# Patient Record
Sex: Male | Born: 1970 | Race: White | Hispanic: No | Marital: Married | Smoking: Current every day smoker
Health system: Southern US, Community
[De-identification: ages and names within clinical notes are randomized; demographics above are authoritative.]

## PROBLEM LIST (undated history)

## (undated) DIAGNOSIS — Z720 Tobacco use: Secondary | ICD-10-CM

## (undated) HISTORY — PX: TYMPANOSTOMY TUBE PLACEMENT: SHX32

## (undated) HISTORY — PX: TONSILLECTOMY: SUR1361

---

## 2014-07-24 ENCOUNTER — Emergency Department (HOSPITAL_BASED_OUTPATIENT_CLINIC_OR_DEPARTMENT_OTHER)
Admission: EM | Admit: 2014-07-24 | Discharge: 2014-07-24 | Disposition: A | Discharge status: Home / Self Care | Payer: 59 | Attending: Emergency Medicine | Admitting: Emergency Medicine

## 2014-07-24 ENCOUNTER — Encounter (HOSPITAL_BASED_OUTPATIENT_CLINIC_OR_DEPARTMENT_OTHER): Payer: Self-pay | Admitting: *Deleted

## 2014-07-24 DIAGNOSIS — S51012A Laceration without foreign body of left elbow, initial encounter: Secondary | ICD-10-CM

## 2014-07-24 DIAGNOSIS — Y998 Other external cause status: Secondary | ICD-10-CM | POA: Insufficient documentation

## 2014-07-24 DIAGNOSIS — Y92410 Unspecified street and highway as the place of occurrence of the external cause: Secondary | ICD-10-CM | POA: Diagnosis not present

## 2014-07-24 DIAGNOSIS — Y9389 Activity, other specified: Secondary | ICD-10-CM | POA: Diagnosis not present

## 2014-07-24 DIAGNOSIS — S50811A Abrasion of right forearm, initial encounter: Secondary | ICD-10-CM | POA: Insufficient documentation

## 2014-07-24 DIAGNOSIS — S40212A Abrasion of left shoulder, initial encounter: Secondary | ICD-10-CM | POA: Diagnosis not present

## 2014-07-24 DIAGNOSIS — Z720 Tobacco use: Secondary | ICD-10-CM | POA: Diagnosis not present

## 2014-07-24 MED ORDER — LIDOCAINE-EPINEPHRINE 2 %-1:100000 IJ SOLN
20.0000 mL | Freq: Once | INTRAMUSCULAR | Status: AC
  Administered 2014-07-24: 20 mL via INTRADERMAL
  Filled 2014-07-24: qty 1

## 2014-07-24 MED ORDER — OXYCODONE-ACETAMINOPHEN 5-325 MG PO TABS: 1.0000 | ORAL_TABLET | ORAL | Status: DC | PRN
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
## 2014-07-24 NOTE — Discharge Instructions (Signed)
Take the prescribed medication as directed.  If you would prefer to take tylenol/motrin that is fine too. Monitor wounds for signs of infection including redness, swelling, drainage, high fever, etc. Sutures need to be removed in one week. Keep them clean and dry until this time. Return to the ED for new or worsening symptoms.

## 2014-08-03 ENCOUNTER — Emergency Department (HOSPITAL_BASED_OUTPATIENT_CLINIC_OR_DEPARTMENT_OTHER)
Admission: EM | Admit: 2014-08-03 | Discharge: 2014-08-03 | Disposition: A | Discharge status: Home / Self Care | Payer: 59 | Attending: Emergency Medicine | Admitting: Emergency Medicine

## 2014-08-03 ENCOUNTER — Encounter (HOSPITAL_BASED_OUTPATIENT_CLINIC_OR_DEPARTMENT_OTHER): Payer: Self-pay | Admitting: *Deleted

## 2014-08-03 DIAGNOSIS — Z4802 Encounter for removal of sutures: Secondary | ICD-10-CM | POA: Diagnosis present

## 2014-08-03 DIAGNOSIS — Z720 Tobacco use: Secondary | ICD-10-CM | POA: Insufficient documentation

## 2014-08-03 NOTE — ED Provider Notes (Signed)
CSN: [id number]     Arrival date & time 08/03/14  2035 History   First MD Initiated Contact with Patient 08/03/14 2042     Chief Complaint  Patient presents with  . Suture / Staple Removal     (Consider location/radiation/quality/duration/timing/severity/associated sxs/prior Treatment) HPI Anthony Sampson is a 44 y.o. male who comes in for suture removal. Patient received 4 sutures to his left elbow on 7/8. He denies any problems or difficulties with this procedure. He denies any redness or swelling to his arm. No fevers, chills or other evidence of infection. He reports having used mild soap, Neosporin as well as hydrogen peroxide to help cleaning. No discomfort now in ED.No other aggravating or modifying factors  History reviewed. No pertinent past medical history. Past Surgical History  Procedure Laterality Date  . Tonsillectomy    . Tympanostomy tube placement     No family history on file. History  Substance Use Topics  . Smoking status: Current Every Day Smoker -- 1.00 packs/day    Types: Cigarettes  . Smokeless tobacco: Not on file  . Alcohol Use: No    Review of Systems A 10 point review of systems was completed and was negative except for pertinent positives and negatives as mentioned in the history of present illness     Allergies  Review of patient's allergies indicates no known allergies.  Home Medications   Prior to Admission medications   Medication Sig Start Date End Date Taking? Authorizing Provider  oxyCODONE-acetaminophen (PERCOCET/ROXICET) 5-325 MG per tablet Take 1 tablet by mouth every 4 (four) hours as needed. 07/24/14   Garlon HatchetLisa M Sanders, PA-C   BP 147/102 mmHg  Pulse 96  Temp(Src) 98 F (36.7 C) (Oral)  Resp 18  Ht 6\' 2"  (1.88 m)  Wt 225 lb (102.059 kg)  BMI 28.88 kg/m2  SpO2 93% Physical Exam  Constitutional:  Awake, alert, nontoxic appearance.  HENT:  Head: Atraumatic.  Eyes: Right eye exhibits no discharge. Left eye exhibits no discharge.   Neck: Neck supple.  Pulmonary/Chest: Effort normal. He exhibits no tenderness.  Abdominal: Soft. There is no tenderness. There is no rebound.  Musculoskeletal: He exhibits no tenderness.  Baseline ROM, no obvious new focal weakness.  Neurological:  Mental status and motor strength appears baseline for patient and situation.  Skin: No rash noted.  Well healing laceration noted to posterior aspect of left elbow. 4 sutures in place.  Psychiatric: He has a normal mood and affect.  Nursing note and vitals reviewed.   ED Course  SUTURE REMOVAL Date/Time: 08/03/2014 8:50 PM Performed by: Joycie PeekARTNER, Xandra Laramee Authorized by: Joycie PeekARTNER, Yannis Broce Consent: Verbal consent obtained. Risks and benefits: risks, benefits and alternatives were discussed Consent given by: patient Patient understanding: patient states understanding of the procedure being performed Patient identity confirmed: verbally with patient Time out: Immediately prior to procedure a "time out" was called to verify the correct patient, procedure, equipment, support staff and site/side marked as required. Body area: upper extremity Location details: left elbow Wound Appearance: clean Sutures Removed: 4 Facility: sutures placed in this facility Patient tolerance: Patient tolerated the procedure well with no immediate complications   (including critical care time)  Labs Review Labs Reviewed - No data to display  Imaging Review No results found.   EKG Interpretation None     Filed Vitals:   08/03/14 2038  BP: 147/102  Pulse: 96  Temp: 98 F (36.7 C)  TempSrc: Oral  Resp: 18  Height: 6\' 2"  (1.88 m)  Weight: 225 lb (102.059 kg)  SpO2: 93%    MDM  Patient here for evaluation of suture removal. 4 sutures removed by myself at bedside. No evidence of infection. Discussed cessation of peroxide at this time to allow for good bacteria to facilitate healing. We'll begin to follow-up with his doctors for further evaluation and  management of symptoms. Vital signs are stable, patient is appropriate for discharge at this time. Final diagnoses:  Visit for suture removal      Joycie Peek, PA-C 08/03/14 2055  Elwin Mocha, MD 08/03/14 2055

## 2014-08-03 NOTE — ED Notes (Signed)
Patient has sutures to his left elbow. Wound is still healing. Incision area is well approximated.

## 2014-08-03 NOTE — Discharge Instructions (Signed)
Suture Removal, Care After °Refer to this sheet in the next few weeks. These instructions provide you with information on caring for yourself after your procedure. Your health care provider may also give you more specific instructions. Your treatment has been planned according to current medical practices, but problems sometimes occur. Call your health care provider if you have any problems or questions after your procedure. °WHAT TO EXPECT AFTER THE PROCEDURE °After your stitches (sutures) are removed, it is typical to have the following: °· Some discomfort and swelling in the wound area. °· Slight redness in the area. °HOME CARE INSTRUCTIONS  °· If you have skin adhesive strips over the wound area, do not take the strips off. They will fall off on their own in a few days. If the strips remain in place after 14 days, you may remove them. °· Change any bandages (dressings) at least once a day or as directed by your health care provider. If the bandage sticks, soak it off with warm, soapy water. °· Apply cream or ointment only as directed by your health care provider. If using cream or ointment, wash the area with soap and water 2 times a day to remove all the cream or ointment. Rinse off the soap and pat the area dry with a clean towel. °· Keep the wound area dry and clean. If the bandage becomes wet or dirty, or if it develops a bad smell, change it as soon as possible. °· Continue to protect the wound from injury. °· Use sunscreen when out in the sun. New scars become sunburned easily. °SEEK MEDICAL CARE IF: °· You have increasing redness, swelling, or pain in the wound. °· You see pus coming from the wound. °· You have a fever. °· You notice a bad smell coming from the wound or dressing. °· Your wound breaks open (edges not staying together). °Document Released: 09/27/2000 Document Revised: 10/23/2012 Document Reviewed: 08/14/2012 °[employer]® Patient Information ©2015 [employer]. This information is not  intended to replace advice given to you by your health care provider. Make sure you discuss any questions you have with your health care provider. ° °Scar Minimization °You will have a scar anytime you have surgery and a cut is made in the skin or you have something removed from your skin (mole, skin cancer, cyst). Although scars are unavoidable following surgery, there are ways to minimize their appearance. °It is important to follow all the instructions you receive from your caregiver about wound care. How your wound heals will influence the appearance of your scar. If you do not follow the wound care instructions as directed, complications such as infection may occur. Wound instructions include keeping the wound clean, moist, and not letting the wound form a scab. Some people form scars that are raised and lumpy (hypertrophic) or larger than the initial wound (keloidal). °HOME CARE INSTRUCTIONS  °· Follow wound care instructions as directed. °· Keep the wound clean by washing it with soap and water. °· Keep the wound moist with provided antibiotic cream or petroleum jelly until completely healed. Moisten twice a day for about 2 weeks. °· Get stitches (sutures) taken out at the scheduled time. °· Avoid touching or manipulating your wound unless needed. Wash your hands thoroughly before and after touching your wound. °· Follow all restrictions such as limits on exercise or work. This depends on where your scar is located. °· Keep the scar protected from sunburn. Cover the scar with sunscreen/sunblock with SPF 30 or higher. °·   Gently massage the scar using a circular motion to help minimize the appearance of the scar. Do this only after the wound has closed and all the sutures have been removed. °· For hypertrophic or keloidal scars, there are several ways to treat and minimize their appearance. Methods include compression therapy, intralesional corticosteroids, laser therapy, or surgery. These methods are performed  by your caregiver. °Remember that the scar may appear lighter or darker than your normal skin color. This difference in color should even out with time. °SEEK MEDICAL CARE IF:  °· You have a fever. °· You develop signs of infection such as pain, redness, pus, and warmth. °· You have questions or concerns. °Document Released: 06/22/2009 Document Revised: 03/27/2011 Document Reviewed: 06/22/2009 °[employer]® Patient Information ©2015 [employer]. This information is not intended to replace advice given to you by your health care provider. Make sure you discuss any questions you have with your health care provider. ° °

## 2014-08-03 NOTE — ED Notes (Signed)
Here for suture removal left elbow.

## 2016-01-15 ENCOUNTER — Inpatient Hospital Stay (HOSPITAL_COMMUNITY): Payer: BLUE CROSS/BLUE SHIELD | Admitting: Anesthesiology

## 2016-01-15 ENCOUNTER — Inpatient Hospital Stay (HOSPITAL_BASED_OUTPATIENT_CLINIC_OR_DEPARTMENT_OTHER)
Admission: EM | Admit: 2016-01-15 | Discharge: 2016-01-18 | DRG: 854 | Disposition: A | Discharge status: Home / Self Care | Payer: BLUE CROSS/BLUE SHIELD | Attending: Internal Medicine | Admitting: Internal Medicine

## 2016-01-15 ENCOUNTER — Encounter (HOSPITAL_COMMUNITY)
Admission: EM | Disposition: A | Discharge status: Home / Self Care | Payer: Self-pay | Source: Home / Self Care | Attending: Internal Medicine

## 2016-01-15 ENCOUNTER — Encounter (HOSPITAL_BASED_OUTPATIENT_CLINIC_OR_DEPARTMENT_OTHER): Payer: Self-pay | Admitting: *Deleted

## 2016-01-15 ENCOUNTER — Emergency Department (HOSPITAL_BASED_OUTPATIENT_CLINIC_OR_DEPARTMENT_OTHER): Payer: BLUE CROSS/BLUE SHIELD

## 2016-01-15 DIAGNOSIS — Z8261 Family history of arthritis: Secondary | ICD-10-CM | POA: Diagnosis not present

## 2016-01-15 DIAGNOSIS — Z8249 Family history of ischemic heart disease and other diseases of the circulatory system: Secondary | ICD-10-CM

## 2016-01-15 DIAGNOSIS — I10 Essential (primary) hypertension: Secondary | ICD-10-CM | POA: Diagnosis present

## 2016-01-15 DIAGNOSIS — R7302 Impaired glucose tolerance (oral): Secondary | ICD-10-CM | POA: Diagnosis present

## 2016-01-15 DIAGNOSIS — L02413 Cutaneous abscess of right upper limb: Secondary | ICD-10-CM | POA: Diagnosis present

## 2016-01-15 DIAGNOSIS — Z720 Tobacco use: Secondary | ICD-10-CM | POA: Diagnosis present

## 2016-01-15 DIAGNOSIS — L0390 Cellulitis, unspecified: Secondary | ICD-10-CM

## 2016-01-15 DIAGNOSIS — A419 Sepsis, unspecified organism: Secondary | ICD-10-CM | POA: Diagnosis present

## 2016-01-15 DIAGNOSIS — L03113 Cellulitis of right upper limb: Secondary | ICD-10-CM | POA: Diagnosis present

## 2016-01-15 DIAGNOSIS — F17210 Nicotine dependence, cigarettes, uncomplicated: Secondary | ICD-10-CM | POA: Diagnosis present

## 2016-01-15 DIAGNOSIS — R739 Hyperglycemia, unspecified: Secondary | ICD-10-CM | POA: Diagnosis present

## 2016-01-15 DIAGNOSIS — M7989 Other specified soft tissue disorders: Secondary | ICD-10-CM

## 2016-01-15 HISTORY — PX: I & D EXTREMITY: SHX5045

## 2016-01-15 HISTORY — DX: Tobacco use: Z72.0

## 2016-01-15 LAB — PROCALCITONIN: Procalcitonin: 0.24 ng/mL

## 2016-01-15 LAB — BASIC METABOLIC PANEL
Anion gap: 13 (ref 5–15)
BUN: 11 mg/dL (ref 6–20)
CHLORIDE: 97 mmol/L — AB (ref 101–111)
CO2: 22 mmol/L (ref 22–32)
Calcium: 9.2 mg/dL (ref 8.9–10.3)
Creatinine, Ser: 1.04 mg/dL (ref 0.61–1.24)
GFR calc Af Amer: >60 mL/min (ref >60)
GFR calc non Af Amer: >60 mL/min (ref >60)
GLUCOSE: 273 mg/dL — AB (ref 65–99)
Potassium: 3.7 mmol/L (ref 3.5–5.1)
Sodium: 132 mmol/L — ABNORMAL LOW (ref 135–145)

## 2016-01-15 LAB — CBC WITH DIFFERENTIAL/PLATELET
Basophils Absolute: 0 10*3/uL (ref 0.0–0.1)
Basophils Relative: 0 %
Eosinophils Absolute: 0.1 10*3/uL (ref 0.0–0.7)
Eosinophils Relative: 0 %
HEMATOCRIT: 45.1 % (ref 39.0–52.0)
Hemoglobin: 15.5 g/dL (ref 13.0–17.0)
LYMPHS ABS: 0.7 10*3/uL (ref 0.7–4.0)
LYMPHS PCT: 5 %
MCH: 31.3 pg (ref 26.0–34.0)
MCHC: 34.4 g/dL (ref 30.0–36.0)
MCV: 91.1 fL (ref 78.0–100.0)
MONO ABS: 1.1 10*3/uL — AB (ref 0.1–1.0)
Monocytes Relative: 7 %
Neutro Abs: 14.7 10*3/uL — ABNORMAL HIGH (ref 1.7–7.7)
Neutrophils Relative %: 88 %
Platelets: 153 10*3/uL (ref 150–400)
RBC: 4.95 MIL/uL (ref 4.22–5.81)
RDW: 12.7 % (ref 11.5–15.5)
WBC: 16.6 10*3/uL — ABNORMAL HIGH (ref 4.0–10.5)

## 2016-01-15 LAB — LACTIC ACID, PLASMA: LACTIC ACID, VENOUS: 0.8 mmol/L (ref 0.5–1.9)

## 2016-01-15 LAB — I-STAT CG4 LACTIC ACID, ED: Lactic Acid, Venous: 1.25 mmol/L (ref 0.5–1.9)

## 2016-01-15 LAB — PROTIME-INR
INR: 1.13
PROTHROMBIN TIME: 14.5 s (ref 11.4–15.2)

## 2016-01-15 LAB — CBG MONITORING, ED: GLUCOSE-CAPILLARY: 167 mg/dL — AB (ref 65–99)

## 2016-01-15 LAB — APTT: APTT: 30 s (ref 24–36)

## 2016-01-15 SURGERY — IRRIGATION AND DEBRIDEMENT EXTREMITY
Anesthesia: Monitor Anesthesia Care | Site: Arm Lower | Laterality: Right

## 2016-01-15 MED ORDER — SODIUM CHLORIDE 0.9 % IV SOLN
INTRAVENOUS | Status: DC
  Administered 2016-01-16: 13:00:00 via INTRAVENOUS

## 2016-01-15 MED ORDER — MIDAZOLAM HCL 5 MG/5ML IJ SOLN
INTRAMUSCULAR | Status: DC | PRN
  Administered 2016-01-15: 2 mg via INTRAVENOUS

## 2016-01-15 MED ORDER — MIDAZOLAM HCL 2 MG/2ML IJ SOLN
INTRAMUSCULAR | Status: AC
  Filled 2016-01-15: qty 2

## 2016-01-15 MED ORDER — ACETAMINOPHEN 650 MG RE SUPP
975.0000 mg | Freq: Once | RECTAL | Status: AC
  Administered 2016-01-15: 975 mg via RECTAL

## 2016-01-15 MED ORDER — LACTATED RINGERS IV SOLN
INTRAVENOUS | Status: DC | PRN
  Administered 2016-01-15: 23:00:00 via INTRAVENOUS

## 2016-01-15 MED ORDER — VANCOMYCIN HCL 10 G IV SOLR
2000.0000 mg | Freq: Once | INTRAVENOUS | Status: AC
  Administered 2016-01-15: 2000 mg via INTRAVENOUS
  Filled 2016-01-15: qty 2000

## 2016-01-15 MED ORDER — CEFAZOLIN IN D5W 1 GM/50ML IV SOLN
1.0000 g | Freq: Three times a day (TID) | INTRAVENOUS | Status: DC
  Administered 2016-01-16 – 2016-01-18 (×7): 1 g via INTRAVENOUS
  Filled 2016-01-15 (×8): qty 50

## 2016-01-15 MED ORDER — MORPHINE SULFATE (PF) 4 MG/ML IV SOLN: 2.0000 mg | INTRAVENOUS | Status: DC | PRN

## 2016-01-15 MED ORDER — VANCOMYCIN HCL 500 MG IV SOLR
INTRAVENOUS | Status: AC
  Filled 2016-01-15: qty 2000

## 2016-01-15 MED ORDER — NICOTINE 21 MG/24HR TD PT24
21.0000 mg | MEDICATED_PATCH | Freq: Every day | TRANSDERMAL | Status: DC
  Filled 2016-01-15 (×2): qty 1

## 2016-01-15 MED ORDER — BUPIVACAINE HCL (PF) 0.5 % IJ SOLN
INTRAMUSCULAR | Status: DC | PRN
  Administered 2016-01-15: 10 mL via PERINEURAL

## 2016-01-15 MED ORDER — MORPHINE SULFATE (PF) 4 MG/ML IV SOLN
4.0000 mg | Freq: Once | INTRAVENOUS | Status: AC
  Administered 2016-01-15: 4 mg via INTRAVENOUS
  Filled 2016-01-15: qty 1

## 2016-01-15 MED ORDER — SENNOSIDES-DOCUSATE SODIUM 8.6-50 MG PO TABS
1.0000 | ORAL_TABLET | Freq: Every evening | ORAL | Status: DC | PRN
  Filled 2016-01-15: qty 1

## 2016-01-15 MED ORDER — SODIUM CHLORIDE 0.9 % IV BOLUS (SEPSIS)
500.0000 mL | Freq: Once | INTRAVENOUS | Status: AC
  Administered 2016-01-15: 1000 mL via INTRAVENOUS

## 2016-01-15 MED ORDER — VANCOMYCIN HCL IN DEXTROSE 1-5 GM/200ML-% IV SOLN
INTRAVENOUS | Status: AC
  Filled 2016-01-15: qty 400

## 2016-01-15 MED ORDER — VANCOMYCIN HCL IN DEXTROSE 1-5 GM/200ML-% IV SOLN
1000.0000 mg | Freq: Three times a day (TID) | INTRAVENOUS | Status: DC
  Administered 2016-01-16: 1000 mg via INTRAVENOUS
  Filled 2016-01-15 (×3): qty 200

## 2016-01-15 MED ORDER — OXYCODONE-ACETAMINOPHEN 5-325 MG PO TABS
1.0000 | ORAL_TABLET | ORAL | Status: DC | PRN
  Administered 2016-01-16 – 2016-01-17 (×4): 1 via ORAL
  Filled 2016-01-15 (×4): qty 1

## 2016-01-15 MED ORDER — SODIUM CHLORIDE 0.9% FLUSH
3.0000 mL | Freq: Two times a day (BID) | INTRAVENOUS | Status: DC
  Administered 2016-01-16 – 2016-01-17 (×4): 3 mL via INTRAVENOUS

## 2016-01-15 MED ORDER — SODIUM CHLORIDE 0.9 % IV BOLUS (SEPSIS)
1000.0000 mL | Freq: Once | INTRAVENOUS | Status: AC
  Administered 2016-01-15: 1000 mL via INTRAVENOUS

## 2016-01-15 MED ORDER — PROPOFOL 500 MG/50ML IV EMUL
INTRAVENOUS | Status: DC | PRN
  Administered 2016-01-15: 100 ug/kg/min via INTRAVENOUS

## 2016-01-15 MED ORDER — ONDANSETRON HCL 4 MG/2ML IJ SOLN: 4.0000 mg | Freq: Three times a day (TID) | INTRAMUSCULAR | Status: AC | PRN

## 2016-01-15 MED ORDER — ROPIVACAINE HCL 7.5 MG/ML IJ SOLN
INTRAMUSCULAR | Status: DC | PRN
  Administered 2016-01-15: 20 mL via PERINEURAL

## 2016-01-15 MED ORDER — ONDANSETRON HCL 4 MG/2ML IJ SOLN
4.0000 mg | Freq: Once | INTRAMUSCULAR | Status: AC
  Administered 2016-01-15: 4 mg via INTRAVENOUS
  Filled 2016-01-15: qty 2

## 2016-01-15 MED ORDER — ACETAMINOPHEN 325 MG RE SUPP
RECTAL | Status: AC
  Filled 2016-01-15: qty 1

## 2016-01-15 MED ORDER — PROPOFOL 1000 MG/100ML IV EMUL
INTRAVENOUS | Status: AC
  Filled 2016-01-15: qty 100

## 2016-01-15 MED ORDER — CEFAZOLIN IN D5W 1 GM/50ML IV SOLN
1.0000 g | Freq: Once | INTRAVENOUS | Status: AC
  Administered 2016-01-15: 1 g via INTRAVENOUS
  Filled 2016-01-15: qty 50

## 2016-01-15 MED ORDER — INSULIN ASPART 100 UNIT/ML ~~LOC~~ SOLN
0.0000 [IU] | Freq: Three times a day (TID) | SUBCUTANEOUS | Status: DC
Start: 2016-01-16 — End: 2016-01-18
  Administered 2016-01-16 – 2016-01-17 (×2): 2 [IU] via SUBCUTANEOUS
  Administered 2016-01-18: 1 [IU] via SUBCUTANEOUS
  Administered 2016-01-18: 2 [IU] via SUBCUTANEOUS

## 2016-01-15 MED ORDER — ACETAMINOPHEN 325 MG PO TABS
650.0000 mg | ORAL_TABLET | Freq: Once | ORAL | Status: AC
  Administered 2016-01-15: 650 mg via ORAL
  Filled 2016-01-15: qty 2

## 2016-01-15 MED ORDER — ZOLPIDEM TARTRATE 5 MG PO TABS: 5.0000 mg | ORAL_TABLET | Freq: Every evening | ORAL | Status: DC | PRN

## 2016-01-15 MED ORDER — FENTANYL CITRATE (PF) 100 MCG/2ML IJ SOLN
INTRAMUSCULAR | Status: AC
  Filled 2016-01-15: qty 2

## 2016-01-15 MED ORDER — ACETAMINOPHEN 325 MG PO TABS
650.0000 mg | ORAL_TABLET | Freq: Four times a day (QID) | ORAL | Status: DC | PRN
  Administered 2016-01-17: 650 mg via ORAL
  Filled 2016-01-15: qty 2

## 2016-01-15 MED ORDER — FENTANYL CITRATE (PF) 100 MCG/2ML IJ SOLN
INTRAMUSCULAR | Status: DC | PRN
  Administered 2016-01-15: 100 ug via INTRAVENOUS

## 2016-01-15 MED ORDER — INSULIN ASPART 100 UNIT/ML ~~LOC~~ SOLN: 0.0000 [IU] | Freq: Every day | SUBCUTANEOUS | Status: DC

## 2016-01-15 MED ORDER — PROPOFOL 10 MG/ML IV BOLUS
INTRAVENOUS | Status: AC
  Filled 2016-01-15: qty 20

## 2016-01-15 MED ORDER — ACETAMINOPHEN 650 MG RE SUPP
RECTAL | Status: AC
  Filled 2016-01-15: qty 1

## 2016-01-15 SURGICAL SUPPLY — 39 items
BANDAGE ACE 4X5 VEL STRL LF (GAUZE/BANDAGES/DRESSINGS) ×3 IMPLANT
BANDAGE ELASTIC 4 VELCRO ST LF (GAUZE/BANDAGES/DRESSINGS) ×6 IMPLANT
BNDG ESMARK 4X9 LF (GAUZE/BANDAGES/DRESSINGS) ×3 IMPLANT
BNDG GAUZE ELAST 4 BULKY (GAUZE/BANDAGES/DRESSINGS) ×3 IMPLANT
CANISTER SUCTION 2500CC (MISCELLANEOUS) ×3 IMPLANT
CORDS BIPOLAR (ELECTRODE) ×3 IMPLANT
COVER SURGICAL LIGHT HANDLE (MISCELLANEOUS) ×3 IMPLANT
DRSG PAD ABDOMINAL 8X10 ST (GAUZE/BANDAGES/DRESSINGS) ×6 IMPLANT
GAUZE IODOFORM PACK 1/2 7832 (GAUZE/BANDAGES/DRESSINGS) ×3 IMPLANT
GAUZE SPONGE 4X4 12PLY STRL (GAUZE/BANDAGES/DRESSINGS) ×3 IMPLANT
GAUZE XEROFORM 5X9 LF (GAUZE/BANDAGES/DRESSINGS) ×3 IMPLANT
GLOVE BIO SURGEON STRL SZ7.5 (GLOVE) ×6 IMPLANT
GLOVE BIOGEL PI IND STRL 8 (GLOVE) ×1 IMPLANT
GLOVE BIOGEL PI INDICATOR 8 (GLOVE) ×2
GOWN STRL REUS W/ TWL LRG LVL3 (GOWN DISPOSABLE) ×1 IMPLANT
GOWN STRL REUS W/TWL LRG LVL3 (GOWN DISPOSABLE) ×2
KIT BASIN OR (CUSTOM PROCEDURE TRAY) ×3 IMPLANT
KIT ROOM TURNOVER OR (KITS) ×3 IMPLANT
NEEDLE HYPO 25X1 1.5 SAFETY (NEEDLE) IMPLANT
NS IRRIG 1000ML POUR BTL (IV SOLUTION) ×3 IMPLANT
PACK ORTHO EXTREMITY (CUSTOM PROCEDURE TRAY) ×3 IMPLANT
PAD ARMBOARD 7.5X6 YLW CONV (MISCELLANEOUS) ×6 IMPLANT
PEN SKIN MARKING BROAD (MISCELLANEOUS) ×3 IMPLANT
SCRUB BETADINE 4OZ XXX (MISCELLANEOUS) ×3 IMPLANT
SET CYSTO W/LG BORE CLAMP LF (SET/KITS/TRAYS/PACK) ×3 IMPLANT
SOLUTION BETADINE 4OZ (MISCELLANEOUS) ×3 IMPLANT
SPLINT PLASTER CAST XFAST 5X30 (CAST SUPPLIES) ×1 IMPLANT
SPLINT PLASTER XFAST SET 5X30 (CAST SUPPLIES) ×2
SPONGE GAUZE 4X4 12PLY STER LF (GAUZE/BANDAGES/DRESSINGS) ×3 IMPLANT
SUT ETHILON 3 0 PS 1 (SUTURE) ×9 IMPLANT
SUT ETHILON 4 0 PS 2 18 (SUTURE) ×3 IMPLANT
SUT MON AB 5-0 P3 18 (SUTURE) IMPLANT
SWAB COLLECTION DEVICE MRSA (MISCELLANEOUS) ×3 IMPLANT
SYR CONTROL 10ML LL (SYRINGE) IMPLANT
TOWEL OR 17X24 6PK STRL BLUE (TOWEL DISPOSABLE) ×3 IMPLANT
TOWEL OR 17X26 10 PK STRL BLUE (TOWEL DISPOSABLE) ×3 IMPLANT
TUBE ANAEROBIC SPECIMEN COL (MISCELLANEOUS) ×3 IMPLANT
UNDERPAD 30X30 (UNDERPADS AND DIAPERS) ×3 IMPLANT
YANKAUER SUCT BULB TIP NO VENT (SUCTIONS) ×3 IMPLANT

## 2016-01-15 NOTE — ED Triage Notes (Signed)
Pt reports noticing a break in the skin in his R knuckle this past Tuesday. Reports cleaning the area and applied antibiotic ointment. Pt began having R arm swelling and pain this past Thursday. Reports low-grade fever (pt has been taking Aleve on a twice-daily basis). Denies n/v/d.

## 2016-01-15 NOTE — ED Notes (Signed)
ED Provider at bedside. 

## 2016-01-15 NOTE — ED Notes (Signed)
[facility]

## 2016-01-15 NOTE — ED Notes (Signed)
Pt transported to OR

## 2016-01-15 NOTE — Anesthesia Procedure Notes (Addendum)
Anesthesia Regional Block:  Axillary brachial plexus block  Pre-Anesthetic Checklist: ,, timeout performed, Correct Patient, Correct Site, Correct Laterality, Correct Procedure, Correct Position, site marked, Risks and benefits discussed, Surgical consent,  Pre-op evaluation,  At surgeon's request  Laterality: Upper and Right  Prep: chloraprep       Needles:  Injection technique: Single-shot  Needle Type: Echogenic Needle          Additional Needles:  Procedures: ultrasound guided (picture in chart) Axillary brachial plexus block Narrative:  Start time: 01/15/2016 10:57 PM End time: 01/15/2016 11:03 PM Injection made incrementally with aspirations every 5 mL.  Performed by: Personally   Additional Notes: H+P and labs reviewed, risks and benefits discussed with patient, procedure tolerated well without complications

## 2016-01-15 NOTE — Anesthesia Preprocedure Evaluation (Signed)
Anesthesia Evaluation  Patient identified by MRN, date of birth, ID band Patient awake    Reviewed: Allergy & Precautions, NPO status , Patient's Chart, lab work & pertinent test results  History of Anesthesia Complications Negative for: history of anesthetic complications  Airway Mallampati: II  TM Distance: >3 FB Neck ROM: Full    Dental  (+) Teeth Intact   Pulmonary Current Smoker,    breath sounds clear to auscultation       Cardiovascular negative cardio ROS   Rhythm:Regular     Neuro/Psych negative neurological ROS  negative psych ROS   GI/Hepatic negative GI ROS, Neg liver ROS,   Endo/Other  Possible diabetes  Renal/GU negative Renal ROS  negative genitourinary   Musculoskeletal negative musculoskeletal ROS (+)   Abdominal   Peds negative pediatric ROS (+)  Hematology negative hematology ROS (+)   Anesthesia Other Findings   Reproductive/Obstetrics negative OB ROS                             Anesthesia Physical Anesthesia Plan  ASA: II  Anesthesia Plan: Regional   Post-op Pain Management:    Induction:   Airway Management Planned: Natural Airway, Nasal Cannula and Simple Face Mask  Additional Equipment: None  Intra-op Plan:   Post-operative Plan:   Informed Consent: I have reviewed the patients History and Physical, chart, labs and discussed the procedure including the risks, benefits and alternatives for the proposed anesthesia with the patient or authorized representative who has indicated his/her understanding and acceptance.   Dental advisory given  Plan Discussed with: CRNA and Surgeon  Anesthesia Plan Comments:         Anesthesia Quick Evaluation

## 2016-01-15 NOTE — H&P (Signed)
History and Physical    Anthony Sampson ATF:[id number] DOB: 04/05/1970 DOA: 01/15/2016  Referring MD/NP/PA:   PCP: No PCP Per Patient   Patient coming from:  The patient is coming from home.  At baseline, pt is independent for most of ADL.  Chief Complaint: right arm swelling, tender, fever  HPI: Anthony Sampson is a 45 y.o. male with medical history significant of tobacco abuse, who presents with right arm swelling, tenderness and fever.  Pt reports noticing a small cut in the skin of right middle finger knuckle Tuesday. He denies human or animal bite. He did cleaning of the area and applied antibiotic ointment. Pt began having R arm swelling and pain on Thursday, which has been progressively getting worse. He developed low-grade fever and chills. Pt has been taking Aleve on a twice-daily basis. He denies nausea, vomiting. No abdominal pain, diarrhea, cough, chest pain, shortness, symptoms of UTI or unilateral weakness.  ED Course: pt was found to have WBC 16.6, like 2-1.25, questionable, temperature [place] port wine, tachycardia, tachypnea. Patient is admitted to telemetry bed as inpatient. Hand surgeon, Dr. Fredna Dow was consulted.  Review of Systems:   General: has fevers, chills, no changes in body weight, has fatigue HEENT: no blurry vision, hearing changes or sore throat Respiratory: no dyspnea, coughing, wheezing CV: no chest pain, no palpitations GI: no nausea, vomiting, abdominal pain, diarrhea, constipation GU: no dysuria, burning on urination, increased urinary frequency, hematuria  Ext: no leg edema. Has right arm swelling and pain. Neuro: no unilateral weakness, numbness, or tingling, no vision change or hearing loss Skin: there is a small wound in right third phalanges.  MSK: No muscle spasm, no deformity, no limitation of range of movement in spin Heme: No easy bruising.  Travel history: No recent long distant travel.  Allergy: No Known Allergies  Past Medical  History:  Diagnosis Date  . Tobacco abuse     Past Surgical History:  Procedure Laterality Date  . TONSILLECTOMY    . TYMPANOSTOMY TUBE PLACEMENT      Social History:  reports that he has been smoking Cigarettes.  He has been smoking about 1.00 pack per day. He has never used smokeless tobacco. He reports that he drinks alcohol. He reports that he does not use drugs.  Family History:  Family History  Problem Relation Age of Onset  . Arthritis Mother   . Hypertension Father   . Heart disease Father      Prior to Admission medications   Medication Sig Start Date End Date Taking? Authorizing Provider  oxyCODONE-acetaminophen (PERCOCET/ROXICET) 5-325 MG per tablet Take 1 tablet by mouth every 4 (four) hours as needed. Patient not taking: Reported on 01/15/2016 07/24/14   Larene Pickett, PA-C    Physical Exam: Vitals:   01/15/16 2100 01/15/16 2115 01/15/16 2130 01/15/16 2145  BP: 161/88 (!) 172/104 (!) 167/101 170/95  Pulse: 112 100 101 105  Resp: _0 Temp:      TempSrc:      SpO2: 98% 98% 97% 97%  Weight:      Height:       General: Not in acute distress HEENT:       Eyes: PERRL, EOMI, no scleral icterus.       ENT: No discharge from the ears and nose, no pharynx injection, no tonsillar enlargement.        Neck: No JVD, no bruit, no mass felt. Heme: No neck lymph node enlargement. Cardiac: S1/S2, RRR, No  murmurs, No gallops or rubs. Respiratory: No rales, wheezing, rhonchi or rubs. GI: Soft, nondistended, nontender, no rebound pain, no organomegaly, BS present. GU: No hematuria Ext: No pitting leg edema bilaterally. 2+DP/PT pulse bilaterally. Musculoskeletal: No joint deformities, No joint redness or warmth, no limitation of ROM in spin. Skin: there is a small wound in right third phalanges at the PIP without discharge or bleeding. Mild redness around the wound.Patient does have erythema,warmth and tenderness in right forearm from wrist up to elbow.  Patient has  full range of motion of phalanges. Cap refill is normal. Sensation intact.  Neuro: Alert, oriented X3, cranial nerves II-XII grossly intact, moves all extremities normally. Psych: Patient is not psychotic, no suicidal or hemocidal ideation.  Labs on Admission: I have personally reviewed following labs and imaging studies  CBC:  Recent Labs Lab 01/15/16 1445  WBC 16.6*  NEUTROABS 14.7*  HGB 15.5  HCT 45.1  MCV 91.1  PLT 956   Basic Metabolic Panel:  Recent Labs Lab 01/15/16 1445  NA 132*  K 3.7  CL 97*  CO2 22  GLUCOSE 273*  BUN 11  CREATININE 1.04  CALCIUM 9.2   GFR: Estimated Creatinine Clearance: 116.6 mL/min (by C-G formula based on SCr of 1.04 mg/dL). Liver Function Tests: No results for input(s): AST, ALT, ALKPHOS, BILITOT, PROT, ALBUMIN in the last 168 hours. No results for input(s): LIPASE, AMYLASE in the last 168 hours. No results for input(s): AMMONIA in the last 168 hours. Coagulation Profile: No results for input(s): INR, PROTIME in the last 168 hours. Cardiac Enzymes: No results for input(s): CKTOTAL, CKMB, CKMBINDEX, TROPONINI in the last 168 hours. BNP (last 3 results) No results for input(s): PROBNP in the last 8760 hours. HbA1C: No results for input(s): HGBA1C in the last 72 hours. CBG: No results for input(s): GLUCAP in the last 168 hours. Lipid Profile: No results for input(s): CHOL, HDL, LDLCALC, TRIG, CHOLHDL, LDLDIRECT in the last 72 hours. Thyroid Function Tests: No results for input(s): TSH, T4TOTAL, FREET4, T3FREE, THYROIDAB in the last 72 hours. Anemia Panel: No results for input(s): VITAMINB12, FOLATE, FERRITIN, TIBC, IRON, RETICCTPCT in the last 72 hours. Urine analysis: No results found for: COLORURINE, APPEARANCEUR, LABSPEC, PHURINE, GLUCOSEU, HGBUR, BILIRUBINUR, KETONESUR, PROTEINUR, UROBILINOGEN, NITRITE, LEUKOCYTESUR Sepsis Labs: _0 (procalcitonin:4,lacticidven:4) )No results found for this or any previous visit (from  the past 240 hour(s)).   Radiological Exams on Admission: Dg Wrist Complete Right  Result Date: 01/15/2016 CLINICAL DATA:  Patient states that he had a cut on his right middle finger over his proximal IP joint on 01/11/16, on 01/12/16 it was infected, has swelling, redness, tenderness up right arm, hand and wrist are very tender to touch, no other complaints EXAM: RIGHT WRIST - COMPLETE 3+ VIEW COMPARISON:  None. FINDINGS: There is no evidence of fracture or dislocation. There is no evidence of arthropathy or other focal bone abnormality. Soft tissues are unremarkable. IMPRESSION: Negative. Electronically Signed   By: Nolon Nations M.D.   On: 01/15/2016 16:02   Dg Hand Complete Right  Result Date: 01/15/2016 CLINICAL DATA:  Patient states that he had a cut on his right middle finger over his proximal IP joint on 01/11/16, on 01/12/16 it was infected, has swelling, redness, tenderness up right arm, hand and wrist are very tender to touch, no other complaints EXAM: RIGHT HAND - COMPLETE 3+ VIEW COMPARISON:  None. FINDINGS: There is no evidence of fracture or dislocation. There is no evidence of arthropathy or other focal bone abnormality.  Soft tissues are unremarkable. IMPRESSION: Negative. Electronically Signed   By: Nolon Nations M.D.   On: 01/15/2016 16:01     EKG: Not done in ED.  Assessment/Plan Principal Problem:   Cellulitis of right arm Active Problems:   Tobacco abuse   Sepsis (HCC)   Hyperglycemia   Cellulitis of right arm and sepsis: pt has right arm swelling, redness, warmth consistent with cellulitis. Pt meets meets criteria for sepsis with leukocytosis, tachycardia, tachypnea and fever. Lactate is normal. Hemodynamically stable. Hand surgeon Dr. Fredna Dow was consulted.  - will admit to tele bed - IV vancomycin and Ancef were started in ED, will continue  - PRN Zofran for nausea, morphine and Percocet for pain - Blood cultures x 2  - ESR and CRP - will get Procalcitonin  and trend lactic acid levels per sepsis protocol. - IVF: 2.5 L of NS bolus in ED, followed by 125 cc/h - INR/PTT - f/u hand surgeon' recommendationss - UE doppler to r/o DVT  Tobacco abuse: -Did counseling about importance of quitting smoking -Nicotine patch  Hyperglycemia: blood sugar 273. Denies hx of DM. -check A1c -SSI   DVT ppx: SCD Code Status: Full code Family Communication: Yes, patient's mom and wife at bed side Disposition Plan:  Anticipate discharge back to previous home environment Consults called:  Copy, dr. Fredna Dow Admission status: Inpatient/tele   Date of Service 01/15/2016    Ivor Costa [facility] Pager [phone]  If 7PM-7AM, please contact night-coverage [email or web address] Password [email or web address] 01/15/2016, 10:15 PM

## 2016-01-15 NOTE — Progress Notes (Signed)
Pharmacy Antibiotic Note  Anthony GritDaniel Herro is a 45 y.o. male admitted on 01/15/2016 with cellulitis.  Pharmacy has been consulted for vancomycin dosing.  Patient received vancomycin 2g and cefazolin 1g IV once in the ED.  Plan: Vancomycin 1g IV every 8 hours.  Goal trough 10-15 mcg/mL.  Monitor culture data, renal function and clinical course VT at SS prn  Height: 6\' 2"  (188 cm) Weight: 240 lb (108.9 kg) IBW/kg (Calculated) : 82.2  Temp (24hrs), Avg:99 F (37.2 C), Min:99 F (37.2 C), Max:99 F (37.2 C)   Recent Labs Lab 01/15/16 1445 01/15/16 1453  WBC 16.6*  --   LATICACIDVEN  --  1.25    CrCl cannot be calculated (No order found.).    No Known Allergies   Arlean Hoppingorey M. Newman PiesBall, PharmD, BCPS Clinical Pharmacist Pager [phone] 01/15/2016 3:20 PM

## 2016-01-15 NOTE — ED Notes (Signed)
Pt alert, NAD, calm, interactive, resps e/u, no dyspnea noted, skin W&D, [facility] without incident or change, rectal tylenol given, denies questions or needs, VSS.

## 2016-01-15 NOTE — Anesthesia Procedure Notes (Signed)
Procedure Name: MAC Date/Time: 01/15/2016 11:06 PM Performed by: Sheppard EvensMANESS, Mervin Ramires B Pre-anesthesia Checklist: Patient identified, Emergency Drugs available, Suction available, Patient being monitored and Timeout performed Patient Re-evaluated:Patient Re-evaluated prior to inductionOxygen Delivery Method: Simple face mask

## 2016-01-15 NOTE — ED Notes (Signed)
Report given to Chrisandra Nettershris C. [facility] at [facility]

## 2016-01-15 NOTE — ED Notes (Addendum)
Spoke with sara at [facility] for transport to [place] er per PA order @ 1712.

## 2016-01-15 NOTE — ED Provider Notes (Signed)
8:45PM [facility] for evaluation by hand surgery.  I spoke with Dr. Merlyn LotKuzma, who has seen the patient. He requests hospitalist admission.  I spoke with Dr. Clyde LundborgNiu, who will admit.   Labs reviewed. Added hemoglobin A1c given elevated blood sugar. Patient is currently comfortable. He has decreased range of motion of fingers, wrist and swelling and erythema of his forearm as previously noted. He appears otherwise well.  BP 165/99 (BP Location: Left Arm)   Pulse 105   Temp (S) (!) 103.1 F (39.5 C) (Rectal)   Resp 16   Ht 6\' 2"  (1.88 m)   Wt 108.9 kg   SpO2 99%   BMI 30.81 kg/m     Anthony CriglerJoshua Rut [place], Anthony Sampson 01/15/16 2220    Benjiman CoreNathan Pickering, MD 01/15/16 [phone]

## 2016-01-15 NOTE — ED Provider Notes (Signed)
[facility] Note   CSN: [id number] Arrival date & time: 01/15/16  1352     History   Chief Complaint Chief Complaint  Patient presents with  . Extremity Pain    HPI Anthony Sampson is a 45 y.o. male.  45 year old Caucasian male with no significant past medical history presents to the ED today with cellulitis. The patient states that approximately 4 days ago he noticed a cut on his right middle finger at the PIP. States that he clean the area and apply antibiotic ointment. States that approximately 2 days ago he began having right arm swelling, redness at the wrist joint and pain that has progressed to today. Patient also reports a low-grade fever and has been taking Aleve to control at home. Patient states that today he noticed a pustule that popped on his wound. Patient also reports red streaking up his right arm to his elbow. He reports pain with movement of his right wrist. Full range of motion of his right elbow. Moving makes the pain worse. Nothing makes the pain better. He denies any headache, vision changes, nausea, emesis, urinary symptoms, abdominal pain, change in bowel habits, numbness/tingling.      History reviewed. No pertinent past medical history.  There are no active problems to display for this patient.   Past Surgical History:  Procedure Laterality Date  . TONSILLECTOMY    . TYMPANOSTOMY TUBE PLACEMENT         Home Medications    Prior to Admission medications   Medication Sig Start Date End Date Taking? Authorizing Provider  oxyCODONE-acetaminophen (PERCOCET/ROXICET) 5-325 MG per tablet Take 1 tablet by mouth every 4 (four) hours as needed. 07/24/14   Garlon Hatchet, PA-C    Family History No family history on file.  Social History Social History  Substance Use Topics  . Smoking status: Current Every Day Smoker    Packs/day: 1.00    Types: Cigarettes  . Smokeless tobacco: Never Used  . Alcohol use Yes     Comment: occ       Allergies   Patient has no known allergies.   Review of Systems Review of Systems  Constitutional: Positive for chills and fever.  Musculoskeletal: Positive for arthralgias and myalgias.  Skin: Positive for color change and wound.  Neurological: Negative for dizziness, syncope, weakness, light-headedness and headaches.  All other systems reviewed and are negative.    Physical Exam Updated Vital Signs BP 170/98 (BP Location: Left Arm)   Pulse 118   Temp 99 F (37.2 C) (Oral)   Resp 16   Ht 6\' 2"  (1.88 m)   Wt 108.9 kg   SpO2 93%   BMI 30.81 kg/m   Physical Exam  Constitutional: He appears well-developed and well-nourished. No distress.  Eyes: Right eye exhibits no discharge. Left eye exhibits no discharge. No scleral icterus.  Neck: Normal range of motion. Neck supple.  Pulmonary/Chest: No respiratory distress.  Musculoskeletal: Normal range of motion.  Lymphadenopathy:    He has no cervical adenopathy.  Neurological: He is alert.  Skin: Skin is warm and dry. Capillary refill takes less than 2 seconds. There is erythema. No pallor.  Wound noted to the right third phalanges at the PIP. No discharge or bleeding is noted. Mild redness around the wound. Patient with full range of motion of phalanges. Cap refill is normal. Sensation intact. Patient does have erythema that starts at the distal right wrist that radiates up to the right elbow with streaking.  Significant edema is noted. Tender to palpation. Limited range of motion due to pain. However patient does have some range of motion with flexion and extension of the wrist. Radial pulses are 2+ bilaterally. Cap refill is normal in all phalanges. Sensation is intact in all phalanges. Patient has full range of motion of his right elbow with no pain.   Nursing note and vitals reviewed.        ED Treatments / Results  Labs (all labs ordered are listed, but only abnormal results are displayed) Labs Reviewed  BASIC  METABOLIC PANEL - Abnormal; Notable for the following:       Result Value   Sodium 132 (*)    Chloride 97 (*)    Glucose, Bld 273 (*)    All other components within normal limits  CBC WITH DIFFERENTIAL/PLATELET - Abnormal; Notable for the following:    WBC 16.6 (*)    Neutro Abs 14.7 (*)    Monocytes Absolute 1.1 (*)    All other components within normal limits  CULTURE, BLOOD (ROUTINE X 2)  CULTURE, BLOOD (ROUTINE X 2)  I-STAT CG4 LACTIC ACID, ED    EKG  EKG Interpretation None       Radiology Dg Wrist Complete Right  Result Date: 01/15/2016 CLINICAL DATA:  Patient states that he had a cut on his right middle finger over his proximal IP joint on 01/11/16, on 01/12/16 it was infected, has swelling, redness, tenderness up right arm, hand and wrist are very tender to touch, no other complaints EXAM: RIGHT WRIST - COMPLETE 3+ VIEW COMPARISON:  None. FINDINGS: There is no evidence of fracture or dislocation. There is no evidence of arthropathy or other focal bone abnormality. Soft tissues are unremarkable. IMPRESSION: Negative. Electronically Signed   By: Norva PavlovElizabeth  Brown M.D.   On: 01/15/2016 16:02   Dg Hand Complete Right  Result Date: 01/15/2016 CLINICAL DATA:  Patient states that he had a cut on his right middle finger over his proximal IP joint on 01/11/16, on 01/12/16 it was infected, has swelling, redness, tenderness up right arm, hand and wrist are very tender to touch, no other complaints EXAM: RIGHT HAND - COMPLETE 3+ VIEW COMPARISON:  None. FINDINGS: There is no evidence of fracture or dislocation. There is no evidence of arthropathy or other focal bone abnormality. Soft tissues are unremarkable. IMPRESSION: Negative. Electronically Signed   By: Norva PavlovElizabeth  Brown M.D.   On: 01/15/2016 16:01    Procedures Procedures (including critical care time)  Medications Ordered in ED Medications  vancomycin (VANCOCIN) 2,000 mg in sodium chloride 0.9 % 500 mL IVPB (not administered)    morphine 4 MG/ML injection 4 mg (not administered)  ondansetron (ZOFRAN) injection 4 mg (not administered)  vancomycin (VANCOCIN) IVPB 1000 mg/200 mL premix (not administered)  sodium chloride 0.9 % bolus 1,000 mL (0 mLs Intravenous Stopped 01/15/16 1525)  acetaminophen (TYLENOL) tablet 650 mg (650 mg Oral Given 01/15/16 1459)  ceFAZolin (ANCEF) IVPB 1 g/50 mL premix (1 g Intravenous New Bag/Given 01/15/16 1523)     Initial Impression / Assessment and Plan / ED Course  I have reviewed the triage vital signs and the nursing notes.  Pertinent labs & imaging results that were available during my care of the patient were reviewed by me and considered in my medical decision making (see chart for details).  Clinical Course   Patient presentation consistent with cellulitis. Patient is febrile and tachycardic. Leukocytosis of 16 is noted. All other labs unremarkable. Lactic is  normal. Patient is neurovascularly intact. Patient is normotensive. Patient with range of motion of the right wrist but with pain. X-rays were obtained that showed no bony involvement. Cellulitis extends from the right wrist with streaking up to the right elbow. Given that lactic acid was normal bolus of fluid was given. Tylenol was given. Patient is neurovascularly intact however given the amount of edema in the right arm concern for development of compartment syndrome. IV antibiotics have been given. Blood cultures were obtained prior to administration of antibiotics. Will consult hand surgery for further recommendations. Spoke with Dr. Merlyn LotKuzma who [facility] in the ED. Patient has antibiotic running. Second fluid bolus given. He is hemodynamically stable at this time. Tachycardic as slightly improved. Dr. Ranae PalmsYelverton has seen and evaluated patient and agrees with the above plan. I have spoken with [facility] Corlis [facility]Mackuen [facility]. EMTALA forms filled out and [facility] was called. Patient is  waiting for transport at this time. Dr. Merlyn LotKuzma will see patient in the ED.   Final Clinical Impressions(s) / ED Diagnoses   Final diagnoses:  Cellulitis    New Prescriptions New Prescriptions   No medications on file     Rise MuKenneth T Esmay Amspacher, PA-C 01/15/16 1719    Loren Raceravid Yelverton, MD 01/16/16 [phone]

## 2016-01-16 ENCOUNTER — Encounter (HOSPITAL_COMMUNITY): Payer: BLUE CROSS/BLUE SHIELD

## 2016-01-16 ENCOUNTER — Encounter (HOSPITAL_COMMUNITY): Payer: Self-pay | Admitting: Orthopedic Surgery

## 2016-01-16 DIAGNOSIS — R739 Hyperglycemia, unspecified: Secondary | ICD-10-CM

## 2016-01-16 DIAGNOSIS — Z720 Tobacco use: Secondary | ICD-10-CM

## 2016-01-16 DIAGNOSIS — L03113 Cellulitis of right upper limb: Secondary | ICD-10-CM

## 2016-01-16 DIAGNOSIS — A419 Sepsis, unspecified organism: Principal | ICD-10-CM

## 2016-01-16 LAB — GRAM STAIN

## 2016-01-16 LAB — LACTIC ACID, PLASMA: Lactic Acid, Venous: 1.3 mmol/L (ref 0.5–1.9)

## 2016-01-16 LAB — GLUCOSE, CAPILLARY
GLUCOSE-CAPILLARY: 163 mg/dL — AB (ref 65–99)
GLUCOSE-CAPILLARY: 107 mg/dL — AB (ref 65–99)
GLUCOSE-CAPILLARY: 178 mg/dL — AB (ref 65–99)
Glucose-Capillary: 111 mg/dL — ABNORMAL HIGH (ref 65–99)
Glucose-Capillary: 116 mg/dL — ABNORMAL HIGH (ref 65–99)
Glucose-Capillary: 125 mg/dL — ABNORMAL HIGH (ref 65–99)

## 2016-01-16 LAB — CBC
HEMATOCRIT: 42 % (ref 39.0–52.0)
HEMATOCRIT: 42.5 % (ref 39.0–52.0)
Hemoglobin: 14.5 g/dL (ref 13.0–17.0)
Hemoglobin: 14.4 g/dL (ref 13.0–17.0)
MCH: 31.5 pg (ref 26.0–34.0)
MCH: 31.6 pg (ref 26.0–34.0)
MCHC: 34.3 g/dL (ref 30.0–36.0)
MCHC: 34.1 g/dL (ref 30.0–36.0)
MCV: 92.3 fL (ref 78.0–100.0)
MCV: 92.2 fL (ref 78.0–100.0)
PLATELETS: 153 10*3/uL (ref 150–400)
Platelets: 145 10*3/uL — ABNORMAL LOW (ref 150–400)
RBC: 4.61 MIL/uL (ref 4.22–5.81)
RBC: 4.55 MIL/uL (ref 4.22–5.81)
RDW: 13 % (ref 11.5–15.5)
RDW: 12.6 % (ref 11.5–15.5)
WBC: 17.7 10*3/uL — ABNORMAL HIGH (ref 4.0–10.5)
WBC: 16.7 10*3/uL — AB (ref 4.0–10.5)

## 2016-01-16 LAB — CREATININE, SERUM
CREATININE: 0.8 mg/dL (ref 0.61–1.24)
GFR calc non Af Amer: >60 mL/min (ref >60)

## 2016-01-16 LAB — BASIC METABOLIC PANEL
Anion gap: 11 (ref 5–15)
BUN: 9 mg/dL (ref 6–20)
CALCIUM: 8.6 mg/dL — AB (ref 8.9–10.3)
CO2: 23 mmol/L (ref 22–32)
CREATININE: 0.99 mg/dL (ref 0.61–1.24)
Chloride: 104 mmol/L (ref 101–111)
GFR calc non Af Amer: >60 mL/min (ref >60)
GLUCOSE: 122 mg/dL — AB (ref 65–99)
Potassium: 3.6 mmol/L (ref 3.5–5.1)
Sodium: 138 mmol/L (ref 135–145)

## 2016-01-16 LAB — HIV ANTIBODY (ROUTINE TESTING W REFLEX): HIV SCREEN 4TH GENERATION: NONREACTIVE

## 2016-01-16 LAB — SEDIMENTATION RATE: SED RATE: 57 mm/h — AB (ref 0–16)

## 2016-01-16 LAB — MRSA PCR SCREENING: MRSA BY PCR: NEGATIVE

## 2016-01-16 LAB — C-REACTIVE PROTEIN: CRP: 19.5 mg/dL — ABNORMAL HIGH (ref <1.0)

## 2016-01-16 MED ORDER — VITAMIN C 500 MG PO TABS
1000.0000 mg | ORAL_TABLET | Freq: Every day | ORAL | Status: DC
  Administered 2016-01-16 – 2016-01-18 (×3): 1000 mg via ORAL
  Filled 2016-01-16 (×3): qty 2

## 2016-01-16 MED ORDER — OXYCODONE HCL 5 MG/5ML PO SOLN: 5.0000 mg | Freq: Once | ORAL | Status: DC | PRN

## 2016-01-16 MED ORDER — LACTATED RINGERS IV SOLN: INTRAVENOUS | Status: DC

## 2016-01-16 MED ORDER — FENTANYL CITRATE (PF) 100 MCG/2ML IJ SOLN: 25.0000 ug | INTRAMUSCULAR | Status: DC | PRN

## 2016-01-16 MED ORDER — ENOXAPARIN SODIUM 40 MG/0.4ML ~~LOC~~ SOLN
40.0000 mg | SUBCUTANEOUS | Status: DC
  Administered 2016-01-17 – 2016-01-18 (×3): 40 mg via SUBCUTANEOUS
  Filled 2016-01-16 (×2): qty 0.4

## 2016-01-16 MED ORDER — OXYCODONE HCL 5 MG PO TABS: 5.0000 mg | ORAL_TABLET | Freq: Once | ORAL | Status: DC | PRN

## 2016-01-16 MED ORDER — INFLUENZA VAC SPLIT QUAD 0.5 ML IM SUSY
0.5000 mL | PREFILLED_SYRINGE | INTRAMUSCULAR | Status: AC
  Administered 2016-01-17: 0.5 mL via INTRAMUSCULAR
  Filled 2016-01-16: qty 0.5

## 2016-01-16 NOTE — Progress Notes (Addendum)
PROGRESS NOTE    Ahmadou Bolz  RUE:[id number] DOB: 30-Mar-1970 DOA: 01/15/2016  PCP: No PCP Per Patient   Brief Narrative:  Anthony Sampson is a 45 y.o. male with medical history significant of tobacco abuse, who presents with right arm swelling, tenderness and fever. He reports noticing a small cut in the skin of right middle finger knuckle Tuesday. He later developed a swelling over his arm and streaking up to his elbow along with fever and chills. Admitted for cellulitis, abscess and sepsis.   Subjective: S/p I and D - no complaints  Assessment & Plan:   Principal Problem:   Cellulitis/ abscess of right arm/   Sepsis - s/p I and D and fasciotomy by hand surgery - culture sent>> no organisms seen on stain - cont  Ancef- MRSA PCR negative- d/c'd Vanc today - WBC improving    Active Problems:    Tobacco abuse  - nicotine patch    Hyperglycemia - A1c pending  DVT prophylaxis: Lovenox Code Status: Full code Family Communication:  Disposition Plan: follow on med surg Consultants:   Hand surgery Procedures:   I and D and Fasciotomy  Antimicrobials:  Anti-infectives    Start     Dose/Rate Route Frequency Ordered Stop   01/16/16 0000  vancomycin (VANCOCIN) IVPB 1000 mg/200 mL premix     1,000 mg 200 mL/hr over 60 Minutes Intravenous Every 8 hours 01/15/16 1608     01/15/16 2215  ceFAZolin (ANCEF) IVPB 1 g/50 mL premix     1 g 100 mL/hr over 30 Minutes Intravenous Every 8 hours 01/15/16 2208     01/15/16 1712  vancomycin (VANCOCIN) 500 MG powder    Comments:  Doug Sou   : cabinet override      01/15/16 1712 01/16/16 0514   01/15/16 1650  vancomycin (VANCOCIN) 1-5 GM/200ML-% IVPB  Status:  Discontinued    Comments:  Karl Luke   : cabinet override      01/15/16 1650 01/15/16 1713   01/15/16 1530  ceFAZolin (ANCEF) IVPB 1 g/50 mL premix     1 g 100 mL/hr over 30 Minutes Intravenous  Once 01/15/16 1519 01/15/16 1645   01/15/16 1530  vancomycin (VANCOCIN)  2,000 mg in sodium chloride 0.9 % 500 mL IVPB     2,000 mg 250 mL/hr over 120 Minutes Intravenous  Once 01/15/16 1522 01/15/16 1950       Objective: Vitals:   01/16/16 0100 01/16/16 0407 01/16/16 0700 01/16/16 1340  BP: (!) 145/98 (!) 148/99  (!) 146/88  Pulse: (!) 101 (!) 106  (!) 101  Resp: 16 16  18   Temp: 98.1 F (36.7 C) 97.9 F (36.6 C) 97.9 F (36.6 C) 98.7 F (37.1 C)  TempSrc: Oral Oral Oral Oral  SpO2: 100% 97%  97%  Weight: 108.9 kg (240 lb)     Height: 6\' 2"  (1.88 m)       Intake/Output Summary (Last 24 hours) at 01/16/16 1540 Last data filed at 01/16/16 1342  Gross per 24 hour  Intake           1607.5 ml  Output             1210 ml  Net            397.5 ml   Filed Weights   01/15/16 1408 01/16/16 0100  Weight: 108.9 kg (240 lb) 108.9 kg (240 lb)    Examination: General exam: Appears comfortable  HEENT: PERRLA, oral mucosa moist, no  sclera icterus or thrush Respiratory system: Clear to auscultation. Respiratory effort normal. Cardiovascular system: S1 & S2 heard, RRR.  No murmurs  Gastrointestinal system: Abdomen soft, non-tender, nondistended. Normal bowel sound. No organomegaly Central nervous system: Alert and oriented. No focal neurological deficits. Extremities: No cyanosis, clubbing or edema- right arm in dressing and ACE wrap Skin: No rashes or ulcers Psychiatry:  Mood & affect appropriate.     Data Reviewed: I have personally reviewed following labs and imaging studies  CBC:  Recent Labs Lab 01/15/16 1445 01/16/16 0054 01/16/16 1232  WBC 16.6* 17.7* 16.7*  NEUTROABS 14.7*  --   --   HGB 15.5 14.5 14.4  HCT 45.1 42.5 42.0  MCV 91.1 92.2 92.3  PLT 153 153 145*   Basic Metabolic Panel:  Recent Labs Lab 01/15/16 1445 01/16/16 0054 01/16/16 1232  NA 132* 138  --   K 3.7 3.6  --   CL 97* 104  --   CO2 22 23  --   GLUCOSE 273* 122*  --   BUN 11 9  --   CREATININE 1.04 0.99 0.80  CALCIUM 9.2 8.6*  --    GFR: Estimated  Creatinine Clearance: 151.6 mL/min (by C-G formula based on SCr of 0.8 mg/dL). Liver Function Tests: No results for input(s): AST, ALT, ALKPHOS, BILITOT, PROT, ALBUMIN in the last 168 hours. No results for input(s): LIPASE, AMYLASE in the last 168 hours. No results for input(s): AMMONIA in the last 168 hours. Coagulation Profile:  Recent Labs Lab 01/15/16 2226  INR 1.13   Cardiac Enzymes: No results for input(s): CKTOTAL, CKMB, CKMBINDEX, TROPONINI in the last 168 hours. BNP (last 3 results) No results for input(s): PROBNP in the last 8760 hours. HbA1C: No results for input(s): HGBA1C in the last 72 hours. CBG:  Recent Labs Lab 01/15/16 2224 01/16/16 0043 01/16/16 0122 01/16/16 0615 01/16/16 1225  GLUCAP 167* 125* 116* 163* 107*   Lipid Profile: No results for input(s): CHOL, HDL, LDLCALC, TRIG, CHOLHDL, LDLDIRECT in the last 72 hours. Thyroid Function Tests: No results for input(s): TSH, T4TOTAL, FREET4, T3FREE, THYROIDAB in the last 72 hours. Anemia Panel: No results for input(s): VITAMINB12, FOLATE, FERRITIN, TIBC, IRON, RETICCTPCT in the last 72 hours. Urine analysis: No results found for: COLORURINE, APPEARANCEUR, LABSPEC, PHURINE, GLUCOSEU, HGBUR, BILIRUBINUR, KETONESUR, PROTEINUR, UROBILINOGEN, NITRITE, LEUKOCYTESUR Sepsis Labs: @LABRCNTIP (procalcitonin:4,lacticidven:4) ) Recent Results (from the past 240 hour(s))  Gram stain     Status: None   Collection Time: 01/15/16 11:32 PM  Result Value Ref Range Status   Specimen Description ABSCESS RIGHT FOREARM  Final   Special Requests PATIENT ON FOLLOWING VANCOMYCIN  Final   Gram Stain   Final    RARE WBC PRESENT,BOTH PMN AND MONONUCLEAR NO ORGANISMS SEEN    Report Status 01/16/2016 FINAL  Final  MRSA PCR Screening     Status: None   Collection Time: 01/16/16  1:16 PM  Result Value Ref Range Status   MRSA by PCR NEGATIVE NEGATIVE Final    Comment:        The GeneXpert MRSA Assay (FDA approved for NASAL  specimens only), is one component of a comprehensive MRSA colonization surveillance program. It is not intended to diagnose MRSA infection nor to guide or monitor treatment for MRSA infections.          Radiology Studies: Dg Wrist Complete Right  Result Date: 01/15/2016 CLINICAL DATA:  Patient states that he had a cut on his right middle finger over his proximal IP  joint on 01/11/16, on 01/12/16 it was infected, has swelling, redness, tenderness up right arm, hand and wrist are very tender to touch, no other complaints EXAM: RIGHT WRIST - COMPLETE 3+ VIEW COMPARISON:  None. FINDINGS: There is no evidence of fracture or dislocation. There is no evidence of arthropathy or other focal bone abnormality. Soft tissues are unremarkable. IMPRESSION: Negative. Electronically Signed   By: Norva PavlovElizabeth  Brown M.D.   On: 01/15/2016 16:02   Dg Hand Complete Right  Result Date: 01/15/2016 CLINICAL DATA:  Patient states that he had a cut on his right middle finger over his proximal IP joint on 01/11/16, on 01/12/16 it was infected, has swelling, redness, tenderness up right arm, hand and wrist are very tender to touch, no other complaints EXAM: RIGHT HAND - COMPLETE 3+ VIEW COMPARISON:  None. FINDINGS: There is no evidence of fracture or dislocation. There is no evidence of arthropathy or other focal bone abnormality. Soft tissues are unremarkable. IMPRESSION: Negative. Electronically Signed   By: Norva PavlovElizabeth  Brown M.D.   On: 01/15/2016 16:01      Scheduled Meds: .  ceFAZolin (ANCEF) IV  1 g Intravenous Q8H  . [START ON 01/17/2016] enoxaparin (LOVENOX) injection  40 mg Subcutaneous Q24H  . Influenza vac split quadrivalent PF  0.5 mL Intramuscular Tomorrow-1000  . insulin aspart  0-5 Units Subcutaneous QHS  . insulin aspart  0-9 Units Subcutaneous TID WC  . nicotine  21 mg Transdermal Daily  . sodium chloride flush  3 mL Intravenous Q12H  . vancomycin  1,000 mg Intravenous Q8H  . vitamin C  1,000 mg  Oral Daily   Continuous Infusions: . sodium chloride 125 mL/hr at 01/16/16 1251  . lactated ringers 75 mL/hr at 01/16/16 0500     LOS: 1 day    Time spent in minutes: 35    Bettye Sitton, MD [facility] Pager: [email or web address] Password [email or web address] 01/16/2016, 3:40 PM

## 2016-01-16 NOTE — Progress Notes (Signed)
D; Family said MD did not come to talk to them, told go to Room then I will page MD,  Family did not come to room yet, gave Dr's page number to RN to page  When Family @ room, so they can talk to MD

## 2016-01-16 NOTE — Transfer of Care (Signed)
Immediate Anesthesia Transfer of Care Note  Patient: Anthony Sampson  Procedure(s) Performed: Procedure(s): IRRIGATION AND DEBRIDEMENT RIGHT FOREARM WITH FASCIOTOMY. (Right)  Patient Location: PACU  Anesthesia Type:MAC and Regional  Level of Consciousness: awake, alert  and oriented  Airway & Oxygen Therapy: Patient Spontanous Breathing  Post-op Assessment: Report given to RN and Post -op Vital signs reviewed and stable  Post vital signs: Reviewed and stable  Last Vitals:  Vitals:   01/15/16 2130 01/15/16 2145  BP: (!) 167/101 170/95  Pulse: 101 105  Resp: 18 15  Temp:      Last Pain:  Vitals:   01/15/16 2057  TempSrc:   PainSc: 3          Complications: No apparent anesthesia complications

## 2016-01-16 NOTE — Op Note (Signed)
NAME:  Anthony Sampson, Anthony Sampson            ACCOUNT NO.:  [id number]  MEDICAL RECORD NO.:  [id number]  LOCATION:  ED                            FACILITY:  [facility]  PHYSICIAN:  Betha LoaKevin Jozy Mcphearson, MD        DATE OF BIRTH:  1970/10/26  DATE OF PROCEDURE:  01/16/2016 DATE OF DISCHARGE:                              OPERATIVE REPORT   PREOPERATIVE DIAGNOSIS:  Right arm abscess.  POSTOPERATIVE DIAGNOSIS:  Right arm abscess/inflammation.  PROCEDURE:   1. Incision and drainage of right forearm deep abscess  2. Fasciotomy of right forearm volar compartment.  SURGEON:  Betha LoaKevin Casidy Alberta, MD.  ASSISTANT:  None.  ANESTHESIA:  Regional with sedation.  IV FLUIDS:  Per anesthesia flow sheet.  ESTIMATED BLOOD LOSS:  Minimal.  COMPLICATIONS:  None.  SPECIMENS:  Cultures to Micro.  TOURNIQUET TIME:  Approximately 46 minutes.  DISPOSITION:  Stable to PACU.  INDICATIONS:  Anthony Sampson is a 45 year old right-hand dominant male, who couple of days ago had a wounds to the long finger.  This developed a pustule yesterday which popped.  This morning, he began to have swelling, erythema, and pain of the distal volar forearm.  This progressed over the day.  [facility] was [facility] and [facility].  On examination, he had an erythematous volar aspect of the forearm with tightness in the volar compartment.  He was able to move the fingers and wrist.  I recommended incision and drainage in the operating room.  He had a white blood count of 16.6 and temperature of 103.  Risks, benefits, and alternatives of surgery were discussed including risk of blood loss; infection; damage to nerves, vessels, tendons, ligaments, bone; failure of surgery; need for additional surgery; complications with wound healing; continued pain; continued infection; need for repeat irrigation and debridement.  He voiced understanding of these risks and elected to proceed.  OPERATIVE COURSE:   After being identified preoperatively by myself, the patient and I agreed upon procedure and site of procedure.  Surgical site was marked.  The risks, benefits, and alternatives of surgery were reviewed; and he wished to proceed.  Surgical consent had been signed. He was given IV vancomycin at [facility].  He was transferred to the operating room and placed on operating table in supine position with the right upper extremity on arm board.  A regional block had been performed by Anesthesia in preoperative holding.  IV sedation was induced in the operating room.  Right upper extremity was prepped and draped in normal sterile orthopedic fashion.  Surgical pause was performed between surgeons, anesthesia, and operating room staff; and all were in agreement as to the patient, procedure, and site of procedure.  Tourniquet at the proximal aspect of the extremity was inflated to 250 mmHg after exsanguination of the limb with an Esmarch bandage.  Incision was made at the distal volar aspect of the forearm over the FCR tendon.  This was carried into subcutaneous tissues by spreading technique.  Bipolar electrocautery was used to obtain hemostasis.  The FCR tendon sheath was incised on its superficial and deep portions.  The median nerve and palmar cutaneous branch  were identified and protected throughout the case.  They had been pushed radially.  The flexor tendons were accessed.  There was yellow thin watery fluid surrounding them.  Cultures taken for aerobes and anaerobes.  The muscle belly was also pushed up through the fascial planes after the fascia had been incised.  No gross purulence was encountered.  The volar forearm was tight.  It was felt that he may be developing an impending compartment syndrome.  The incision was extended proximally.  The fascia was released in its entirety up to the elbow. The muscle pushed up through the fascia after the release.  The wound was then  copiously irrigated with 2000 mL of sterile saline by cysto tubing.  The proximal aspect of the skin incision was able to be closed with 3-0 nylon suture in a horizontal mattress fashion.  The distal aspect was left open and packed with 0.5-inch iodoform gauze.  The wound was then dressed with sterile Xeroform along the closed portion and 4x4s and wrapped with a Kerlix bandage.  A posterior splint was placed and wrapped with Kerlix and Ace bandage.  Tourniquet was deflated at approximately 46 minutes.  Fingertips were pink with brisk capillary refill after deflation of tourniquet.  Operative drapes were broken down, and the patient was awoken from anesthesia safely.  He was transferred back to stretcher and taken to PACU in stable condition.  He is being admitted by the hospitalist for continued IV antibiotics.     Betha LoaKevin Yobana Culliton, MD     KK/MEDQ  D:  01/16/2016  T:  01/16/2016  Job:  [id number]

## 2016-01-16 NOTE — Brief Op Note (Signed)
01/15/2016 - 01/16/2016  12:21 AM  PATIENT:  Anthony Sampson  45 y.o. male  PRE-OPERATIVE DIAGNOSIS:  infected right arm  POST-OPERATIVE DIAGNOSIS:  infected right arm  PROCEDURE:  Procedure(s): IRRIGATION AND DEBRIDEMENT RIGHT FOREARM WITH FASCIOTOMY. (Right)  SURGEON:  Surgeon(s) and Role:    * Betha LoaKevin Undrea Archbold, MD - Primary  PHYSICIAN ASSISTANT:   ASSISTANTS: none   ANESTHESIA:   regional and IV sedation  EBL:  Total I/O In: 300 [I.V.:300] Out: 10 [Blood:10]  BLOOD ADMINISTERED:none  DRAINS: iodoform packing  LOCAL MEDICATIONS USED:  NONE  SPECIMEN:  Source of Specimen:  right forearm  DISPOSITION OF SPECIMEN:  micro  COUNTS:  YES  TOURNIQUET:   Total Tourniquet Time Documented: Upper Arm (Right) - -1394 minutes Total: Upper Arm (Right) - -1394 minutes   DICTATION: .Other Dictation: Dictation Number [id number]  PLAN OF CARE: Admit to inpatient   PATIENT DISPOSITION:  PACU - hemodynamically stable.   Delay start of Pharmacological VTE agent (>24hrs) due to surgical blood loss or risk of bleeding: no

## 2016-01-16 NOTE — Progress Notes (Signed)
Subjective: 1 Day Post-Op Procedure(s) (LRB): IRRIGATION AND DEBRIDEMENT RIGHT FOREARM WITH FASCIOTOMY. (Right) Patient reports pain as mild.  Has taken two percocet today.  Feels well.  No fevers, chills, sweats.  Objective: Vital signs in last 24 hours: Temp:  [97.9 F (36.6 C)-103.1 F (39.5 C)] 97.9 F (36.6 C) (12/31 0700) Pulse Rate:  [96-118] 106 (12/31 0407) Resp:  [11-20] 16 (12/31 0407) BP: (139-177)/(88-114) 148/99 (12/31 0407) SpO2:  [93 %-100 %] 97 % (12/31 0407) Weight:  [108.9 kg (240 lb)] 108.9 kg (240 lb) (12/31 0100)  Intake/Output from previous day: 12/30 0701 - 12/31 0700 In: 2107.5 [P.O.:540; I.V.:567.5; IV Piggyback:1000] Out: 610 [Urine:600; Blood:10] Intake/Output this shift: No intake/output data recorded.   Recent Labs  01/15/16 1445 01/16/16 0054 01/16/16 1232  HGB 15.5 14.5 14.4    Recent Labs  01/16/16 0054 01/16/16 1232  WBC 17.7* 16.7*  RBC 4.61 4.55  HCT 42.5 42.0  PLT 153 145*    Recent Labs  01/15/16 1445 01/16/16 0054  NA 132* 138  K 3.7 3.6  CL 97* 104  CO2 22 23  BUN 11 9  CREATININE 1.04 0.99  GLUCOSE 273* 122*  CALCIUM 9.2 8.6*    Recent Labs  01/15/16 2226  INR 1.13    intact sensation and capillary refill all digits.  +epl/fpl/io.  dressing c/d/i.  Assessment/Plan: 1 Day Post-Op Procedure(s) (LRB): IRRIGATION AND DEBRIDEMENT RIGHT FOREARM WITH FASCIOTOMY. (Right) Doing well.  Afebrile.  Okay for d/c from hand standpoint when wbc begins to normalize.  Will start hydrotherapy in 1-2 days.  This can be done as outpatient at our office.  Toure Edmonds R 01/16/2016, 1:10 PM

## 2016-01-16 NOTE — Progress Notes (Signed)
Orthopedic Tech Progress Note Patient Details:  Anthony GritDaniel Sampson [birth year] [id number]  Ortho Devices Type of Ortho Device: Arm sling Ortho Device/Splint Location: rue Ortho Device/Splint Interventions: Ordered, Application   Trinna PostMartinez, Alysandra Lobue J 01/16/2016, 12:39 AM

## 2016-01-16 NOTE — Op Note (Signed)
[id number] 

## 2016-01-17 LAB — CBC
HCT: 39.3 % (ref 39.0–52.0)
HEMOGLOBIN: 13.6 g/dL (ref 13.0–17.0)
MCH: 31.5 pg (ref 26.0–34.0)
MCHC: 34.6 g/dL (ref 30.0–36.0)
MCV: 91 fL (ref 78.0–100.0)
Platelets: 169 10*3/uL (ref 150–400)
RBC: 4.32 MIL/uL (ref 4.22–5.81)
RDW: 12.7 % (ref 11.5–15.5)
WBC: 14 10*3/uL — ABNORMAL HIGH (ref 4.0–10.5)

## 2016-01-17 LAB — GLUCOSE, CAPILLARY
GLUCOSE-CAPILLARY: 116 mg/dL — AB (ref 65–99)
GLUCOSE-CAPILLARY: 144 mg/dL — AB (ref 65–99)
Glucose-Capillary: 159 mg/dL — ABNORMAL HIGH (ref 65–99)
Glucose-Capillary: 110 mg/dL — ABNORMAL HIGH (ref 65–99)

## 2016-01-17 NOTE — Progress Notes (Signed)
PROGRESS NOTE    Anthony GritDaniel Reddix  ZOX:[id number] DOB: 1970-04-28 DOA: 01/15/2016  PCP: No PCP Per Patient   Brief Narrative:  Anthony Sampson is a [age] y.o. male with medical history significant of tobacco abuse, who presents with right arm swelling, tenderness and fever. He reports noticing a small cut in the skin of right middle finger knuckle Tuesday. He later developed a swelling over his arm and streaking up to his elbow along with fever and chills. Admitted for cellulitis, abscess and sepsis.   Subjective: S/p I and D - no complaints  Assessment & Plan:   Principal Problem:   Cellulitis/ abscess of right arm/   Sepsis - s/p I and D and fasciotomy by hand surgery - culture sent>> no organisms seen on stain - cont  Ancef- MRSA PCR negative- d/c'd Vanc - WBC improving - Dr Merlyn LotKuzma wants patient to go to office in 1-2 days for hydrotherapy  Active Problems:    Tobacco abuse  - nicotine patch    Hyperglycemia - A1c pending  DVT prophylaxis: Lovenox Code Status: Full code Family Communication:  Disposition Plan: follow on med surg Consultants:   Hand surgery Procedures:   I and D and Fasciotomy  Antimicrobials:  Anti-infectives    Start     Dose/Rate Route Frequency Ordered Stop   01/16/16 0000  vancomycin (VANCOCIN) IVPB 1000 mg/200 mL premix  Status:  Discontinued     1,000 mg 200 mL/hr over 60 Minutes Intravenous Every 8 hours 01/15/16 1608 01/16/16 1545   01/15/16 2215  ceFAZolin (ANCEF) IVPB 1 g/50 mL premix     1 g 100 mL/hr over 30 Minutes Intravenous Every 8 hours 01/15/16 2208     01/15/16 1712  vancomycin (VANCOCIN) 500 MG powder    Comments:  Doug SouMasten, Theresa   : cabinet override      01/15/16 1712 01/16/16 0514   01/15/16 1650  vancomycin (VANCOCIN) 1-5 GM/200ML-% IVPB  Status:  Discontinued    Comments:  Karl LukeMabe, Steven   : cabinet override      01/15/16 1650 01/15/16 1713   01/15/16 1530  ceFAZolin (ANCEF) IVPB 1 g/50 mL premix     1 g 100 mL/hr over  30 Minutes Intravenous  Once 01/15/16 1519 01/15/16 1645   01/15/16 1530  vancomycin (VANCOCIN) 2,000 mg in sodium chloride 0.9 % 500 mL IVPB     2,000 mg 250 mL/hr over 120 Minutes Intravenous  Once 01/15/16 1522 01/15/16 1950       Objective: Vitals:   01/16/16 2029 01/16/16 2030 01/17/16 0417 01/17/16 1300  BP: (!) 161/88 (!) 158/96 (!) 143/95 (!) 152/94  Pulse: (!) 103  91 95  Resp:      Temp: 99 F (37.2 C)  98.3 F (36.8 C) 98.4 F (36.9 C)  TempSrc: Oral  Oral Oral  SpO2: 96%  96% 96%  Weight:      Height:        Intake/Output Summary (Last 24 hours) at 01/17/16 1552 Last data filed at 01/17/16 1500  Gross per 24 hour  Intake              880 ml  Output                1 ml  Net              879 ml   Filed Weights   01/15/16 1408 01/16/16 0100  Weight: 108.9 kg (240 lb) 108.9 kg (240 lb)  Examination: General exam: Appears comfortable  HEENT: PERRLA, oral mucosa moist, no sclera icterus or thrush Respiratory system: Clear to auscultation. Respiratory effort normal. Cardiovascular system: S1 & S2 heard, RRR.  No murmurs  Gastrointestinal system: Abdomen soft, non-tender, nondistended. Normal bowel sound. No organomegaly Central nervous system: Alert and oriented. No focal neurological deficits. Extremities: No cyanosis, clubbing or edema- right arm in dressing and ACE wrap Skin: No rashes or ulcers Psychiatry:  Mood & affect appropriate.     Data Reviewed: I have personally reviewed following labs and imaging studies  CBC:  Recent Labs Lab 01/15/16 1445 01/16/16 0054 01/16/16 1232 01/17/16 0520  WBC 16.6* 17.7* 16.7* 14.0*  NEUTROABS 14.7*  --   --   --   HGB 15.5 14.5 14.4 13.6  HCT 45.1 42.5 42.0 39.3  MCV 91.1 92.2 92.3 91.0  PLT 153 153 145* 169   Basic Metabolic Panel:  Recent Labs Lab 01/15/16 1445 01/16/16 0054 01/16/16 1232  NA 132* 138  --   K 3.7 3.6  --   CL 97* 104  --   CO2 22 23  --   GLUCOSE 273* 122*  --   BUN 11 9   --   CREATININE 1.04 0.99 0.80  CALCIUM 9.2 8.6*  --    GFR: Estimated Creatinine Clearance: 151.6 mL/min (by C-G formula based on SCr of 0.8 mg/dL). Liver Function Tests: No results for input(s): AST, ALT, ALKPHOS, BILITOT, PROT, ALBUMIN in the last 168 hours. No results for input(s): LIPASE, AMYLASE in the last 168 hours. No results for input(s): AMMONIA in the last 168 hours. Coagulation Profile:  Recent Labs Lab 01/15/16 2226  INR 1.13   Cardiac Enzymes: No results for input(s): CKTOTAL, CKMB, CKMBINDEX, TROPONINI in the last 168 hours. BNP (last 3 results) No results for input(s): PROBNP in the last 8760 hours. HbA1C: No results for input(s): HGBA1C in the last 72 hours. CBG:  Recent Labs Lab 01/16/16 1225 01/16/16 1702 01/16/16 2034 01/17/16 0701 01/17/16 1101  GLUCAP 107* 111* 178* 116* 159*   Lipid Profile: No results for input(s): CHOL, HDL, LDLCALC, TRIG, CHOLHDL, LDLDIRECT in the last 72 hours. Thyroid Function Tests: No results for input(s): TSH, T4TOTAL, FREET4, T3FREE, THYROIDAB in the last 72 hours. Anemia Panel: No results for input(s): VITAMINB12, FOLATE, FERRITIN, TIBC, IRON, RETICCTPCT in the last 72 hours. Urine analysis: No results found for: COLORURINE, APPEARANCEUR, LABSPEC, PHURINE, GLUCOSEU, HGBUR, BILIRUBINUR, KETONESUR, PROTEINUR, UROBILINOGEN, NITRITE, LEUKOCYTESUR Sepsis Labs: @LABRCNTIP (procalcitonin:4,lacticidven:4) ) Recent Results (from the past 240 hour(s))  Culture, blood (routine x 2)     Status: None (Preliminary result)   Collection Time: 01/15/16  2:45 PM  Result Value Ref Range Status   Specimen Description BLOOD LEFT ARM  Final   Special Requests BOTTLES DRAWN AEROBIC AND ANAEROBIC 5.5CC EACH  Final   Culture   Final    NO GROWTH 1 DAY Performed at [facility]    Report Status PENDING  Incomplete  Culture, blood (routine x 2)     Status: None (Preliminary result)   Collection Time: 01/15/16  3:10 PM  Result  Value Ref Range Status   Specimen Description BLOOD LEFT HAND  Final   Special Requests BOTTLES DRAWN AEROBIC AND ANAEROBIC 5CC EACH  Final   Culture   Final    NO GROWTH 1 DAY Performed at [facility]    Report Status PENDING  Incomplete  Gram stain     Status: None   Collection Time:  01/15/16 11:32 PM  Result Value Ref Range Status   Specimen Description ABSCESS RIGHT FOREARM  Final   Special Requests PATIENT ON FOLLOWING VANCOMYCIN  Final   Gram Stain   Final    RARE WBC PRESENT,BOTH PMN AND MONONUCLEAR NO ORGANISMS SEEN    Report Status 01/16/2016 FINAL  Final  Aerobic/Anaerobic Culture (surgical/deep wound)     Status: None (Preliminary result)   Collection Time: 01/15/16 11:32 PM  Result Value Ref Range Status   Specimen Description ABSCESS RIGHT FOREARM  Final   Special Requests PATIENT ON FOLLOWING VANCOMYCIN  Final   Gram Stain   Final    RARE WBC PRESENT, PREDOMINANTLY MONONUCLEAR NO ORGANISMS SEEN    Culture NO GROWTH 1 DAY  Final   Report Status PENDING  Incomplete  MRSA PCR Screening     Status: None   Collection Time: 01/16/16  1:16 PM  Result Value Ref Range Status   MRSA by PCR NEGATIVE NEGATIVE Final    Comment:        The GeneXpert MRSA Assay (FDA approved for NASAL specimens only), is one component of a comprehensive MRSA colonization surveillance program. It is not intended to diagnose MRSA infection nor to guide or monitor treatment for MRSA infections.          Radiology Studies: No results found.    Scheduled Meds: .  ceFAZolin (ANCEF) IV  1 g Intravenous Q8H  . enoxaparin (LOVENOX) injection  40 mg Subcutaneous Q24H  . insulin aspart  0-5 Units Subcutaneous QHS  . insulin aspart  0-9 Units Subcutaneous TID WC  . nicotine  21 mg Transdermal Daily  . sodium chloride flush  3 mL Intravenous Q12H  . vitamin C  1,000 mg Oral Daily   Continuous Infusions: . sodium chloride 125 mL/hr at 01/16/16 1251  . lactated ringers 75  mL/hr at 01/16/16 0500     LOS: 2 days    Time spent in minutes: 35    Rosalyn Archambault, MD [facility] Pager: [email or web address] Password [email or web address] 01/17/2016, 3:52 PM

## 2016-01-17 NOTE — Anesthesia Postprocedure Evaluation (Addendum)
Anesthesia Post Note  Patient: Chrisotpher Rivero  Procedure(s) Performed: Procedure(s) (LRB): IRRIGATION AND DEBRIDEMENT RIGHT FOREARM WITH FASCIOTOMY. (Right)  Patient location during evaluation: PACU Anesthesia Type: Regional and MAC Level of consciousness: awake and alert Pain management: pain level controlled Vital Signs Assessment: post-procedure vital signs reviewed and stable Respiratory status: spontaneous breathing and respiratory function stable Cardiovascular status: blood pressure returned to baseline and stable Postop Assessment: no signs of nausea or vomiting Anesthetic complications: no       Last Vitals:  Vitals:   01/16/16 2030 01/17/16 0417  BP: (!) 158/96 (!) 143/95  Pulse:  91  Resp:    Temp:  36.8 C    Last Pain:  Vitals:   01/17/16 0538  TempSrc:   PainSc: 0-No pain                 Mayci Haning

## 2016-01-18 DIAGNOSIS — R7302 Impaired glucose tolerance (oral): Secondary | ICD-10-CM

## 2016-01-18 DIAGNOSIS — I10 Essential (primary) hypertension: Secondary | ICD-10-CM

## 2016-01-18 LAB — CBC
HCT: 36.4 % — ABNORMAL LOW (ref 39.0–52.0)
HEMOGLOBIN: 12.4 g/dL — AB (ref 13.0–17.0)
MCH: 31.2 pg (ref 26.0–34.0)
MCHC: 34.1 g/dL (ref 30.0–36.0)
MCV: 91.5 fL (ref 78.0–100.0)
Platelets: 192 10*3/uL (ref 150–400)
RBC: 3.98 MIL/uL — ABNORMAL LOW (ref 4.22–5.81)
RDW: 12.6 % (ref 11.5–15.5)
WBC: 9.5 10*3/uL (ref 4.0–10.5)

## 2016-01-18 LAB — HEMOGLOBIN A1C
Hgb A1c MFr Bld: 5.7 % — ABNORMAL HIGH (ref 4.8–5.6)
Mean Plasma Glucose: 117 mg/dL

## 2016-01-18 LAB — GLUCOSE, CAPILLARY
Glucose-Capillary: 135 mg/dL — ABNORMAL HIGH (ref 65–99)
Glucose-Capillary: 165 mg/dL — ABNORMAL HIGH (ref 65–99)

## 2016-01-18 MED ORDER — CEPHALEXIN 500 MG PO CAPS: 1000.0000 mg | ORAL_CAPSULE | Freq: Two times a day (BID) | ORAL | 0 refills | Status: AC

## 2016-01-18 MED ORDER — ASCORBIC ACID 1000 MG PO TABS
1000.0000 mg | ORAL_TABLET | Freq: Every day | ORAL | Status: AC
Start: 2016-01-18 — End: ?

## 2016-01-18 MED ORDER — OXYCODONE-ACETAMINOPHEN 5-325 MG PO TABS: 1.0000 | ORAL_TABLET | Freq: Four times a day (QID) | ORAL | 0 refills | Status: AC | PRN

## 2016-01-18 MED ORDER — SENNOSIDES-DOCUSATE SODIUM 8.6-50 MG PO TABS: 1.0000 | ORAL_TABLET | Freq: Every evening | ORAL | Status: AC | PRN

## 2016-01-18 NOTE — Progress Notes (Signed)
Discharge instructions reviewed with patient and spouse.  Denies questions.

## 2016-01-18 NOTE — Progress Notes (Signed)
Pt states he has follow up appointment with Dr.Kuzma tomorrow.

## 2016-01-18 NOTE — Discharge Summary (Signed)
Physician Discharge Summary  Anthony Sampson ZOX:[id number] DOB: [birth year] DOA: 01/15/2016  PCP: No PCP Per Patient  Admit date: 01/15/2016 Discharge date: 01/18/2016  Admitted From: home  Disposition:  home   Recommendations for Outpatient Follow-up:  1. Dr Merlyn Lot to f/u cultures and further decide on antibiotics  Home Health:  none  Equipment/Devices:  none    Discharge Condition:  stable   CODE STATUS:  Full code   Diet recommendation:  Heart healthy, low carb Consultations:  Hand surgery    Discharge Diagnoses:  Principal Problem:   Cellulitis of right arm Active Problems:   Tobacco abuse   Sepsis (HCC)   Hyperglycemia   Glucose intolerance (impaired glucose tolerance)   HTN (hypertension)    Subjective: He has no complaints of pain or fever.   Brief Summary: Anthony Sampson a 46 y.o.malewith medical history significant of tobacco abuse, who presents with right arm swelling, tenderness and fever. He reports noticing a small cut in the skin of right middle finger knuckle Tuesday. He later developed a swelling over his arm and streaking up to his elbow along with fever and chills. Admitted for cellulitis, abscess and sepsis.   Hospital Course:  Principal Problem:   Cellulitis/ abscess of right arm/   Sepsis - s/p I and D and fasciotomy by hand surgery on 12/31 - culture sent>> no organisms seen on stain or culture to date - continued on  Ancef- MRSA PCR negative- d/c'd Vanc - WBC improved to normal on Ancef  - have switched to Keflex today for 1 more week - Dr Merlyn Lot wants patient to go to office in 1-2 days for hydrotherapy- Dr Merlyn Lot to decide on duration of antibiotics as he will follow the wound in the office- dressing were not recommended to be opened in the hospital.  Active Problems:  Tobacco abuse  - nicotine patch  Hyperglycemia - A1c 5.7 suggesting glucose intolerance- discussed weight control and low carb foods   HTN - noted during  hospital stay- it can currently be elevated due to stress form infection and pain   Discharge Instructions  Discharge Instructions    Diet - low sodium heart healthy    Complete by:  As directed    Diet Carb Modified    Complete by:  As directed    Increase activity slowly    Complete by:  As directed      Allergies as of 01/18/2016   No Known Allergies     Medication List    TAKE these medications   ascorbic acid 1000 MG tablet Commonly known as:  VITAMIN C Take 1 tablet (1,000 mg total) by mouth daily.   cephALEXin 500 MG capsule Commonly known as:  KEFLEX Take 2 capsules (1,000 mg total) by mouth 2 (two) times daily.   oxyCODONE-acetaminophen 5-325 MG tablet Commonly known as:  PERCOCET/ROXICET Take 1 tablet by mouth every 6 (six) hours as needed. What changed:  when to take this   senna-docusate 8.6-50 MG tablet Commonly known as:  Senokot-S Take 1 tablet by mouth at bedtime as needed for mild constipation.      Follow-up Information    Tami Ribas, MD. Schedule an appointment as soon as possible for a visit.   Specialty:  Orthopedic Surgery Why:  He would like you to have hydrotherapy at his office- please call to make appt. Contact information: [address] [place] [address] [phone]          No Known Allergies  Procedures/Studies: 12/31  1. Incision and drainage of right forearm deep abscess  2. Fasciotomy of right forearm volar compartment.   Dg Wrist Complete Right  Result Date: 01/15/2016 CLINICAL DATA:  Patient states that he had a cut on his right middle finger over his proximal IP joint on 01/11/16, on 01/12/16 it was infected, has swelling, redness, tenderness up right arm, hand and wrist are very tender to touch, no other complaints EXAM: RIGHT WRIST - COMPLETE 3+ VIEW COMPARISON:  None. FINDINGS: There is no evidence of fracture or dislocation. There is no evidence of arthropathy or other focal bone abnormality. Soft  tissues are unremarkable. IMPRESSION: Negative. Electronically Signed   By: Norva PavlovElizabeth  Brown M.D.   On: 01/15/2016 16:02   Dg Hand Complete Right  Result Date: 01/15/2016 CLINICAL DATA:  Patient states that he had a cut on his right middle finger over his proximal IP joint on 01/11/16, on 01/12/16 it was infected, has swelling, redness, tenderness up right arm, hand and wrist are very tender to touch, no other complaints EXAM: RIGHT HAND - COMPLETE 3+ VIEW COMPARISON:  None. FINDINGS: There is no evidence of fracture or dislocation. There is no evidence of arthropathy or other focal bone abnormality. Soft tissues are unremarkable. IMPRESSION: Negative. Electronically Signed   By: Norva PavlovElizabeth  Brown M.D.   On: 01/15/2016 16:01       Discharge Exam: Vitals:   01/17/16 2052 01/18/16 0700  BP: (!) 164/99 (!) 176/86  Pulse: (!) 103 89  Resp: 16 17  Temp: 98.7 F (37.1 C) 98.8 F (37.1 C)   Vitals:   01/17/16 0417 01/17/16 1300 01/17/16 2052 01/18/16 0700  BP: (!) 143/95 (!) 152/94 (!) 164/99 (!) 176/86  Pulse: 91 95 (!) 103 89  Resp:   16 17  Temp: 98.3 F (36.8 C) 98.4 F (36.9 C) 98.7 F (37.1 C) 98.8 F (37.1 C)  TempSrc: Oral Oral Oral Oral  SpO2: 96% 96% 100% 100%  Weight:      Height:        General: Pt is alert, awake, not in acute distress Cardiovascular: RRR, S1/S2 +, no rubs, no gallops Respiratory: CTA bilaterally, no wheezing, no rhonchi Abdominal: Soft, NT, ND, bowel sounds + Extremities: no edema, no cyanosis    The results of significant diagnostics from this hospitalization (including imaging, microbiology, ancillary and laboratory) are listed below for reference.     Microbiology: Recent Results (from the past 240 hour(s))  Culture, blood (routine x 2)     Status: None (Preliminary result)   Collection Time: 01/15/16  2:45 PM  Result Value Ref Range Status   Specimen Description BLOOD LEFT ARM  Final   Special Requests BOTTLES DRAWN AEROBIC AND ANAEROBIC  5.5CC EACH  Final   Culture   Final    NO GROWTH 1 DAY [facility]    Report Status PENDING  Incomplete  Culture, blood (routine x 2)     Status: None (Preliminary result)   Collection Time: 01/15/16  3:10 PM  Result Value Ref Range Status   Specimen Description BLOOD LEFT HAND  Final   Special Requests BOTTLES DRAWN AEROBIC AND ANAEROBIC 5CC EACH  Final   Culture   Final    NO GROWTH 1 DAY Performed at [facility]    Report Status PENDING  Incomplete  Gram stain     Status: None   Collection Time: 01/15/16 11:32 PM  Result Value Ref Range Status   Specimen  Description ABSCESS RIGHT FOREARM  Final   Special Requests PATIENT ON FOLLOWING VANCOMYCIN  Final   Gram Stain   Final    RARE WBC PRESENT,BOTH PMN AND MONONUCLEAR NO ORGANISMS SEEN    Report Status 01/16/2016 FINAL  Final  Aerobic/Anaerobic Culture (surgical/deep wound)     Status: None (Preliminary result)   Collection Time: 01/15/16 11:32 PM  Result Value Ref Range Status   Specimen Description ABSCESS RIGHT FOREARM  Final   Special Requests PATIENT ON FOLLOWING VANCOMYCIN  Final   Gram Stain   Final    RARE WBC PRESENT, PREDOMINANTLY MONONUCLEAR NO ORGANISMS SEEN    Culture NO GROWTH 1 DAY  Final   Report Status PENDING  Incomplete  MRSA PCR Screening     Status: None   Collection Time: 01/16/16  1:16 PM  Result Value Ref Range Status   MRSA by PCR NEGATIVE NEGATIVE Final    Comment:        The GeneXpert MRSA Assay (FDA approved for NASAL specimens only), is one component of a comprehensive MRSA colonization surveillance program. It is not intended to diagnose MRSA infection nor to guide or monitor treatment for MRSA infections.      Labs: BNP (last 3 results) No results for input(s): BNP in the last 8760 hours. Basic Metabolic Panel:  Recent Labs Lab 01/15/16 1445 01/16/16 0054 01/16/16 1232  NA 132* 138  --   K 3.7 3.6  --   CL 97* 104  --   CO2 22 23  --    GLUCOSE 273* 122*  --   BUN 11 9  --   CREATININE 1.04 0.99 0.80  CALCIUM 9.2 8.6*  --    Liver Function Tests: No results for input(s): AST, ALT, ALKPHOS, BILITOT, PROT, ALBUMIN in the last 168 hours. No results for input(s): LIPASE, AMYLASE in the last 168 hours. No results for input(s): AMMONIA in the last 168 hours. CBC:  Recent Labs Lab 01/15/16 1445 01/16/16 0054 01/16/16 1232 01/17/16 0520 01/18/16 0552  WBC 16.6* 17.7* 16.7* 14.0* 9.5  NEUTROABS 14.7*  --   --   --   --   HGB 15.5 14.5 14.4 13.6 12.4*  HCT 45.1 42.5 42.0 39.3 36.4*  MCV 91.1 92.2 92.3 91.0 91.5  PLT 153 153 145* 169 192   Cardiac Enzymes: No results for input(s): CKTOTAL, CKMB, CKMBINDEX, TROPONINI in the last 168 hours. BNP: Invalid input(s): POCBNP CBG:  Recent Labs Lab 01/17/16 0701 01/17/16 1101 01/17/16 1631 01/17/16 2120 01/18/16 0653  GLUCAP 116* 159* 110* 144* 135*   D-Dimer No results for input(s): DDIMER in the last 72 hours. Hgb A1c  Recent Labs  01/15/16 2145  HGBA1C 5.7*   Lipid Profile No results for input(s): CHOL, HDL, LDLCALC, TRIG, CHOLHDL, LDLDIRECT in the last 72 hours. Thyroid function studies No results for input(s): TSH, T4TOTAL, T3FREE, THYROIDAB in the last 72 hours.  Invalid input(s): FREET3 Anemia work up No results for input(s): VITAMINB12, FOLATE, FERRITIN, TIBC, IRON, RETICCTPCT in the last 72 hours. Urinalysis No results found for: COLORURINE, APPEARANCEUR, LABSPEC, PHURINE, GLUCOSEU, HGBUR, BILIRUBINUR, KETONESUR, PROTEINUR, UROBILINOGEN, NITRITE, LEUKOCYTESUR Sepsis Labs Invalid input(s): PROCALCITONIN,  WBC,  LACTICIDVEN Microbiology Recent Results (from the past 240 hour(s))  Culture, blood (routine x 2)     Status: None (Preliminary result)   Collection Time: 01/15/16  2:45 PM  Result Value Ref Range Status   Specimen Description BLOOD LEFT ARM  Final   Special Requests BOTTLES DRAWN AEROBIC  AND ANAEROBIC 5.5CC EACH  Final   Culture    Final    NO GROWTH 1 DAY Performed at [facility]    Report Status PENDING  Incomplete  Culture, blood (routine x 2)     Status: None (Preliminary result)   Collection Time: 01/15/16  3:10 PM  Result Value Ref Range Status   Specimen Description BLOOD LEFT HAND  Final   Special Requests BOTTLES DRAWN AEROBIC AND ANAEROBIC 5CC EACH  Final   Culture   Final    NO GROWTH 1 DAY Performed at [facility]    Report Status PENDING  Incomplete  Gram stain     Status: None   Collection Time: 01/15/16 11:32 PM  Result Value Ref Range Status   Specimen Description ABSCESS RIGHT FOREARM  Final   Special Requests PATIENT ON FOLLOWING VANCOMYCIN  Final   Gram Stain   Final    RARE WBC PRESENT,BOTH PMN AND MONONUCLEAR NO ORGANISMS SEEN    Report Status 01/16/2016 FINAL  Final  Aerobic/Anaerobic Culture (surgical/deep wound)     Status: None (Preliminary result)   Collection Time: 01/15/16 11:32 PM  Result Value Ref Range Status   Specimen Description ABSCESS RIGHT FOREARM  Final   Special Requests PATIENT ON FOLLOWING VANCOMYCIN  Final   Gram Stain   Final    RARE WBC PRESENT, PREDOMINANTLY MONONUCLEAR NO ORGANISMS SEEN    Culture NO GROWTH 1 DAY  Final   Report Status PENDING  Incomplete  MRSA PCR Screening     Status: None   Collection Time: 01/16/16  1:16 PM  Result Value Ref Range Status   MRSA by PCR NEGATIVE NEGATIVE Final    Comment:        The GeneXpert MRSA Assay (FDA approved for NASAL specimens only), is one component of a comprehensive MRSA colonization surveillance program. It is not intended to diagnose MRSA infection nor to guide or monitor treatment for MRSA infections.      Time coordinating discharge: Over 30 minutes  SIGNED:   Calvert Cantor, MD  [facility] 01/18/2016, 9:25 AM Pager   If 7PM-7AM, please contact night-coverage [email or web address] Password [email or web address]

## 2016-01-20 NOTE — Consult Note (Signed)
Late entry  Anthony Sampson is an 46 y.o. male.   Chief Complaint: right forearm infection HPI: 46 yo male noted wound to dorsum of right long finger a few days ago.  In past two days this has led to increasing pain, swelling, erythema of distal volar forearm.  Has had fevers.  There is pain in the forearm.  Worsened with palpation and motion, relieved with rest.  Case discussed with Renne CriglerJoshua Geiple, PA-C and his note from 01/20/2016 reviewed. Xrays viewed and interpreted by me: ap, lateral, oblique views of wrist show no fracture, dislocation, radioopaque foreign body Labs reviewed: WBC 16.7  Allergies: No Known Allergies  Past Medical History:  Diagnosis Date  . Tobacco abuse     Past surgical history: Tonsillectomy Ear tubes  Family History: Family History  Problem Relation Age of Onset  . Arthritis Mother   . Hypertension Father   . Heart disease Father     Social History:   reports that he has been smoking Cigarettes.  He has been smoking about 1.00 pack per day. He has never used smokeless tobacco. He reports that he drinks alcohol. He reports that he does not use drugs.  Medications: No prescriptions prior to admission.    No results found for this or any previous visit (from the past 48 hour(s)).  No results found.   A comprehensive review of systems was negative except for: Constitutional: positive for fevers Review of Systems: No chills, night sweats, chest pain, shortness of breath, nausea, vomiting, diarrhea, constipation, easy bleeding or bruising, headaches, dizziness, vision changes, fainting.   Blood pressure (!) 176/86, pulse 89, temperature 98.8 F (37.1 C), temperature source Oral, resp. rate 17, height 6\' 2"  (1.88 m), weight 108.9 kg (240 lb), SpO2 100 %.  General appearance: alert, cooperative and appears stated age Head: Normocephalic, without obvious abnormality, atraumatic Neck: supple, symmetrical, trachea midline Extremities: Intact sensation  and capillary refill all digits.  +epl/fpl/io.  Small wound on dorsum of long finger without surrounding erythema.  Volar distal forearm with erythema.  TTP.  No proximal streaking.  Forearm firm.  Able to move digits and wrist without significant pain. Pulses: 2+ and symmetric Skin: Skin color, texture, turgor normal. No rashes or lesions Neurologic: Grossly normal Incision/Wound: As above  Assessment/Plan Right forearm abscess.  Recommend incision and drainage in OR.  Risks, benefits, and alternatives of surgery were discussed and the patient agrees with the plan of care.   Rini Moffit R 01/20/2016, 3:08 PM

## 2016-01-21 LAB — CULTURE, BLOOD (ROUTINE X 2)
CULTURE: NO GROWTH
Culture: NO GROWTH

## 2016-01-21 LAB — AEROBIC/ANAEROBIC CULTURE W GRAM STAIN (SURGICAL/DEEP WOUND): Culture: NO GROWTH

## 2016-01-21 LAB — AEROBIC/ANAEROBIC CULTURE (SURGICAL/DEEP WOUND)

## 2016-02-03 ENCOUNTER — Encounter (HOSPITAL_COMMUNITY): Payer: Self-pay | Admitting: Orthopedic Surgery

## 2016-02-03 NOTE — Addendum Note (Signed)
Addendum  created 02/03/16 1249 by Val Eaglehristopher Shantal Roan, MD   Sign clinical note, SmartForm saved

## 2016-03-14 ENCOUNTER — Other Ambulatory Visit: Payer: Self-pay | Admitting: Orthopedic Surgery

## 2016-03-14 DIAGNOSIS — L02413 Cutaneous abscess of right upper limb: Secondary | ICD-10-CM

## 2016-03-26 ENCOUNTER — Ambulatory Visit
Admission: RE | Admit: 2016-03-26 | Discharge: 2016-03-26 | Disposition: A | Discharge status: Home / Self Care | Payer: BLUE CROSS/BLUE SHIELD | Source: Ambulatory Visit | Attending: Orthopedic Surgery | Admitting: Orthopedic Surgery

## 2016-03-26 DIAGNOSIS — L02413 Cutaneous abscess of right upper limb: Secondary | ICD-10-CM

## 2017-08-08 IMAGING — CR DG HAND COMPLETE 3+V*R*
3 series · 3 of 3 positions shown · non-contrast
Comparison: None.

CLINICAL DATA: Patient states that he had a cut on his right middle
finger over his proximal IP joint on 01/11/16, on 01/12/16 it was
infected, has swelling, redness, tenderness up right arm, hand and
wrist are very tender to touch, no other complaints

EXAM:
RIGHT HAND - COMPLETE 3+ VIEW

[x hand pa right]
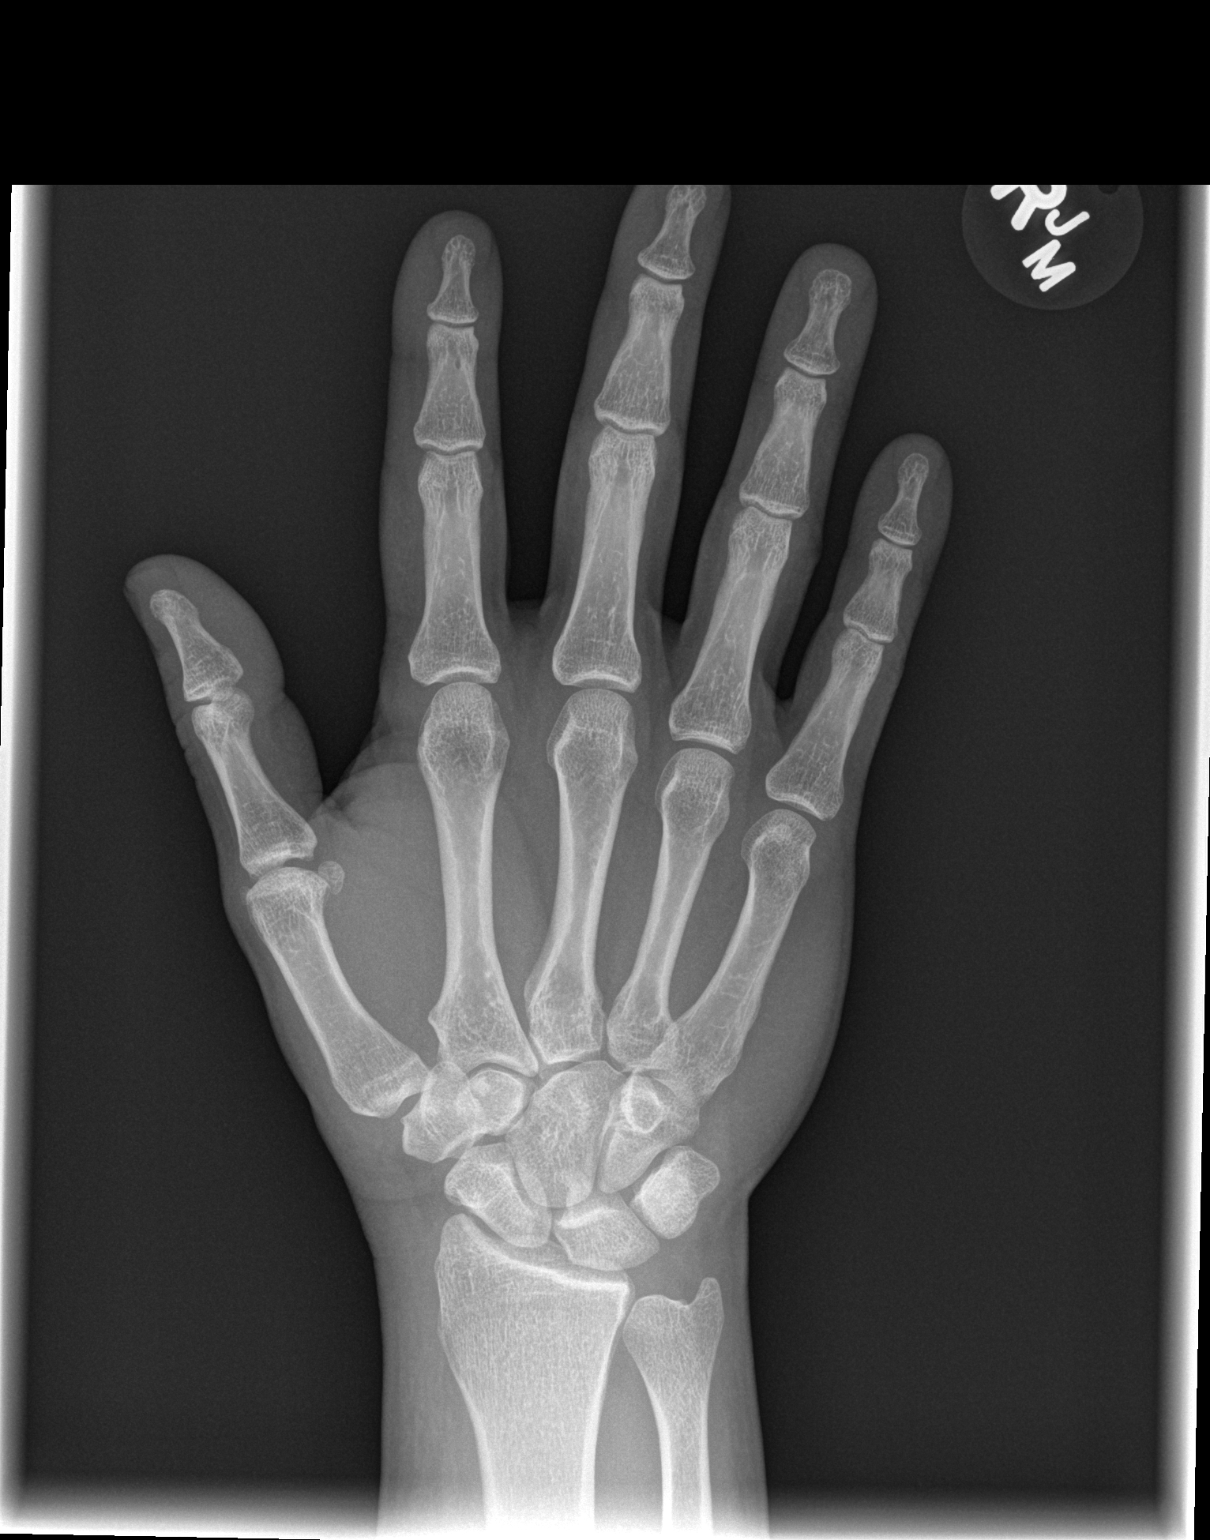

[x hand oblique right]
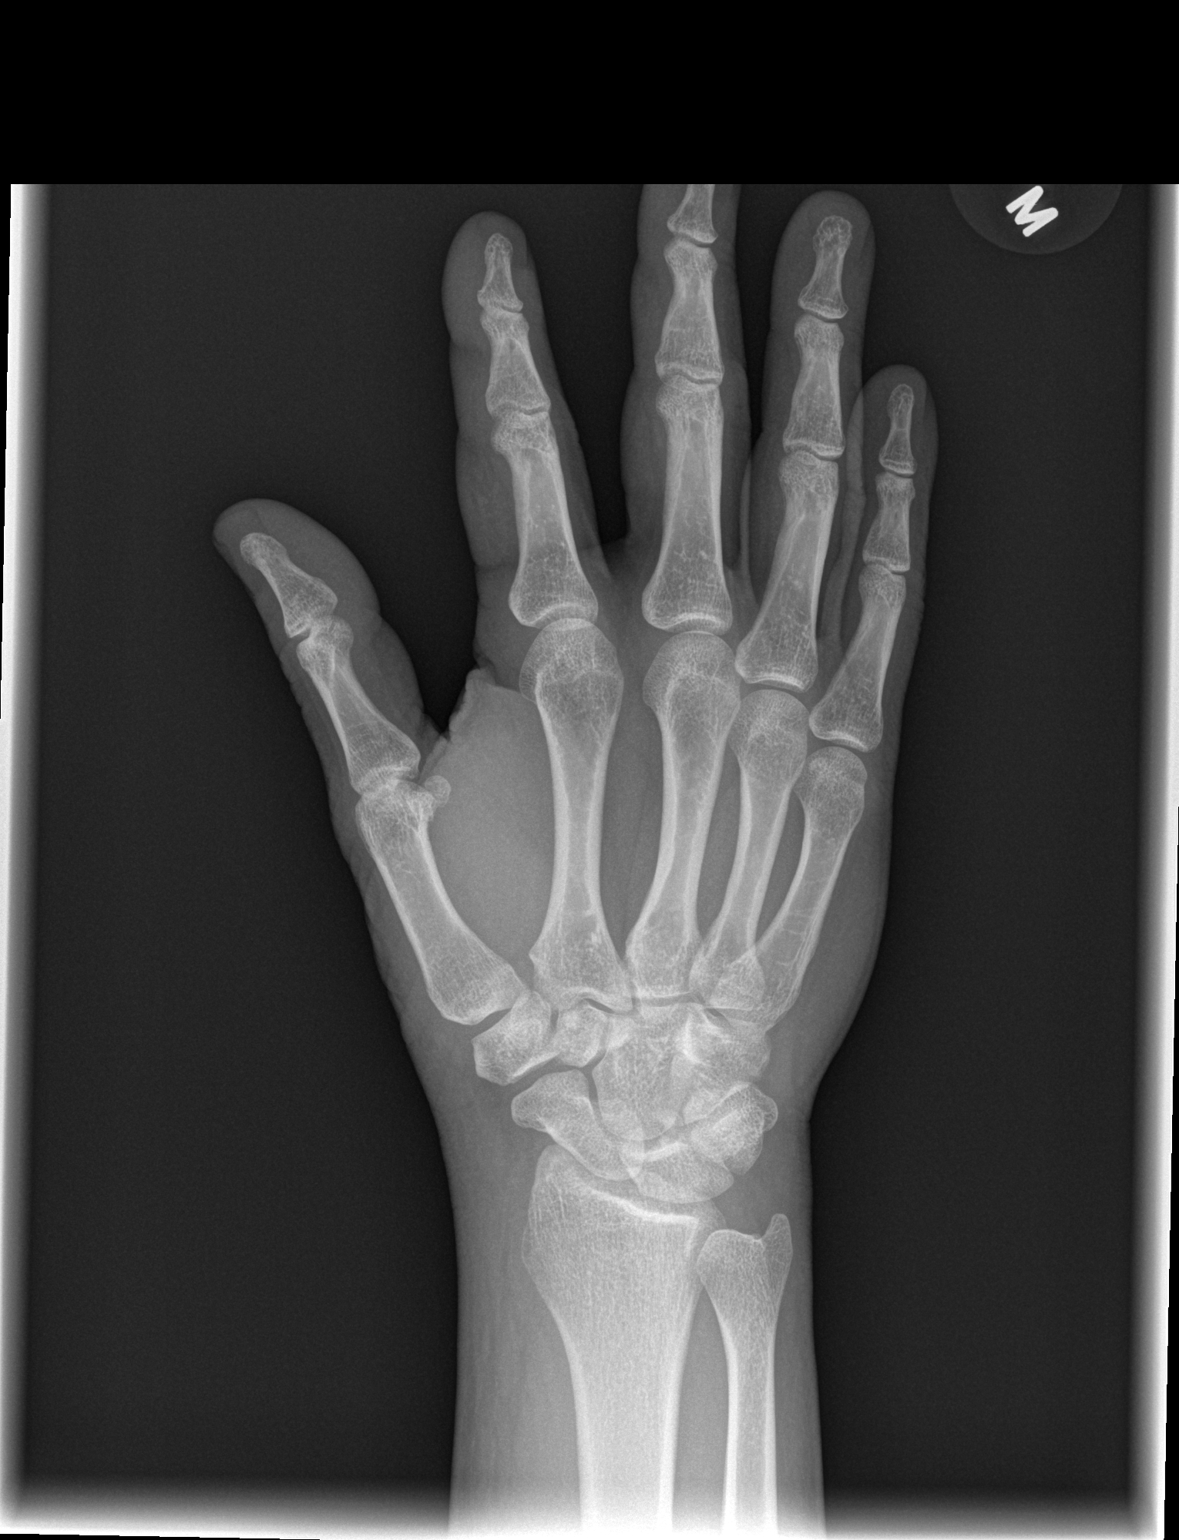

[x hand lat right]
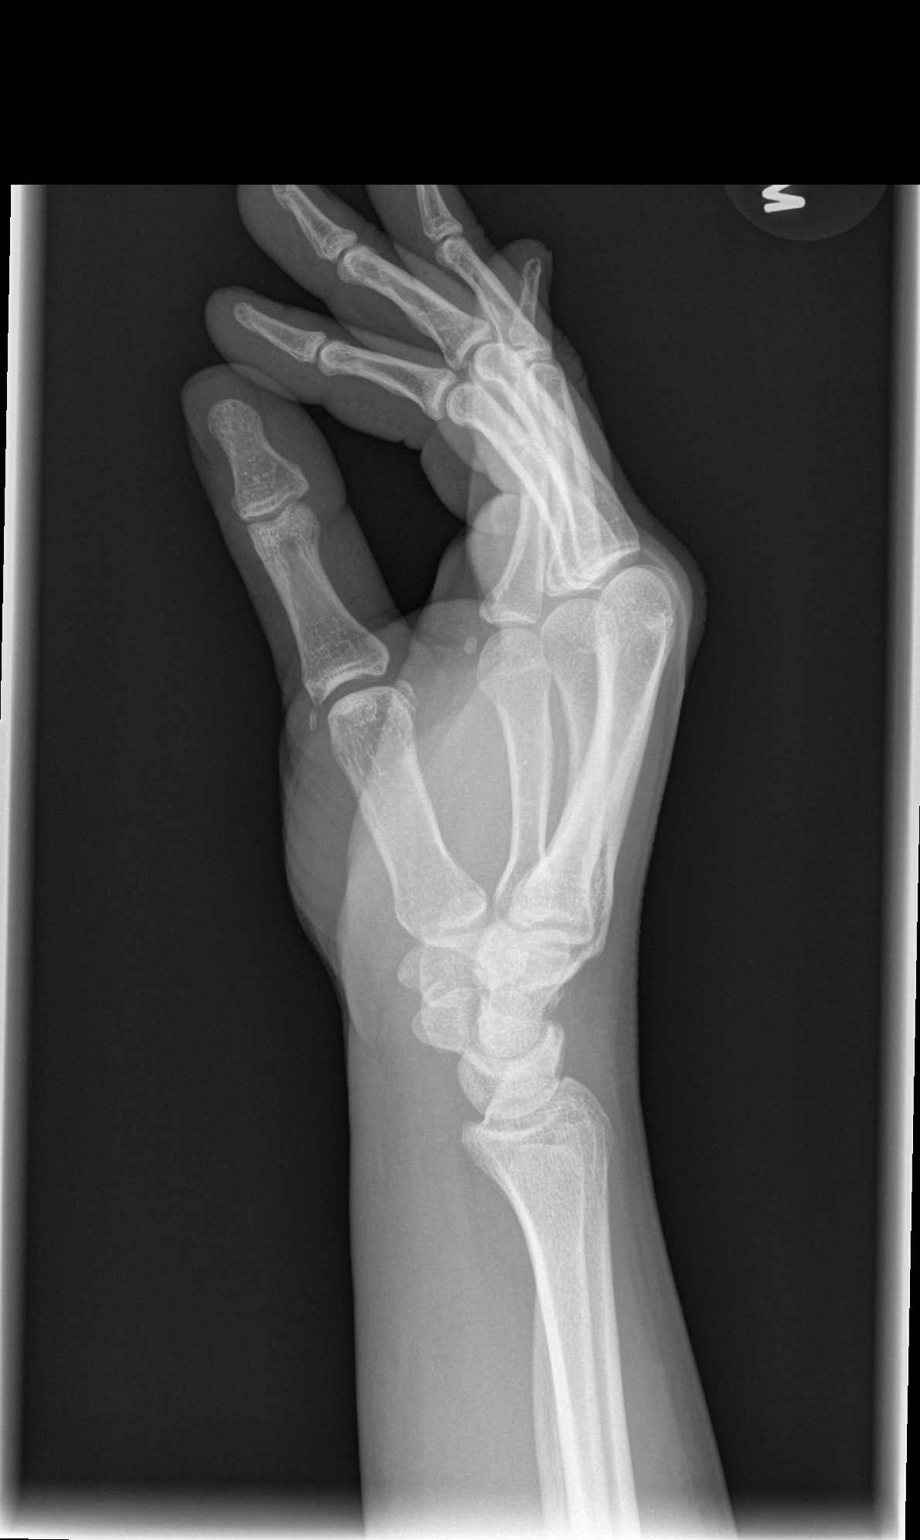

[3 of 3 positions shown; findings below may reference images not displayed]

FINDINGS: There is no evidence of fracture or dislocation. There is no
evidence of arthropathy or other focal bone abnormality. Soft
tissues are unremarkable.
IMPRESSION: Negative.

## 2018-07-10 ENCOUNTER — Other Ambulatory Visit: Payer: Self-pay | Admitting: *Deleted

## 2018-07-10 DIAGNOSIS — Z20822 Contact with and (suspected) exposure to covid-19: Secondary | ICD-10-CM

## 2018-07-15 LAB — NOVEL CORONAVIRUS, NAA: SARS-CoV-2, NAA: NOT DETECTED
